# Patient Record
Sex: Female | Born: 1935
Health system: Southern US, Community
[De-identification: ages and names within clinical notes are randomized; demographics above are authoritative.]

## PROBLEM LIST (undated history)

## (undated) DIAGNOSIS — K30 Functional dyspepsia: Secondary | ICD-10-CM

## (undated) DIAGNOSIS — I10 Essential (primary) hypertension: Secondary | ICD-10-CM

## (undated) DIAGNOSIS — Z9889 Other specified postprocedural states: Secondary | ICD-10-CM

## (undated) DIAGNOSIS — M1990 Unspecified osteoarthritis, unspecified site: Secondary | ICD-10-CM

## (undated) DIAGNOSIS — E0590 Thyrotoxicosis, unspecified without thyrotoxic crisis or storm: Secondary | ICD-10-CM

## (undated) DIAGNOSIS — R112 Nausea with vomiting, unspecified: Secondary | ICD-10-CM

## (undated) HISTORY — PX: TOTAL THYROIDECTOMY: SHX2547

## (undated) HISTORY — PX: OTHER SURGICAL HISTORY: SHX169

## (undated) HISTORY — PX: ABDOMINAL HYSTERECTOMY: SHX81

## (undated) HISTORY — PX: EYE SURGERY: SHX253

## (undated) SURGERY — ARTHROSCOPY, KNEE
Anesthesia: General | Laterality: Right

---

## 1998-04-05 ENCOUNTER — Other Ambulatory Visit: Admission: RE | Admit: 1998-04-05 | Discharge: 1998-04-05 | Payer: Self-pay | Admitting: Obstetrics and Gynecology

## 1999-09-04 ENCOUNTER — Encounter: Admission: RE | Admit: 1999-09-04 | Discharge: 1999-09-04 | Payer: Self-pay | Admitting: Obstetrics and Gynecology

## 1999-09-04 ENCOUNTER — Encounter: Payer: Self-pay | Admitting: Obstetrics and Gynecology

## 2000-08-18 ENCOUNTER — Other Ambulatory Visit: Admission: RE | Admit: 2000-08-18 | Discharge: 2000-08-18 | Payer: Self-pay | Admitting: Obstetrics and Gynecology

## 2000-09-04 ENCOUNTER — Encounter: Admission: RE | Admit: 2000-09-04 | Discharge: 2000-09-04 | Payer: Self-pay | Admitting: Obstetrics and Gynecology

## 2000-09-04 ENCOUNTER — Encounter: Payer: Self-pay | Admitting: Obstetrics and Gynecology

## 2000-09-25 ENCOUNTER — Encounter: Payer: Self-pay | Admitting: Internal Medicine

## 2000-09-25 ENCOUNTER — Encounter: Admission: RE | Admit: 2000-09-25 | Discharge: 2000-09-25 | Payer: Self-pay | Admitting: Internal Medicine

## 2002-11-11 ENCOUNTER — Encounter: Admission: RE | Admit: 2002-11-11 | Discharge: 2002-11-11 | Payer: Self-pay | Admitting: Internal Medicine

## 2003-05-08 ENCOUNTER — Ambulatory Visit (HOSPITAL_COMMUNITY): Admission: RE | Admit: 2003-05-08 | Discharge: 2003-05-08 | Payer: Self-pay | Admitting: Gastroenterology

## 2003-07-12 ENCOUNTER — Encounter: Admission: RE | Admit: 2003-07-12 | Discharge: 2003-07-12 | Payer: Self-pay | Admitting: Internal Medicine

## 2003-11-24 ENCOUNTER — Encounter: Admission: RE | Admit: 2003-11-24 | Discharge: 2003-11-24 | Payer: Self-pay | Admitting: Internal Medicine

## 2005-10-22 ENCOUNTER — Encounter: Admission: RE | Admit: 2005-10-22 | Discharge: 2005-10-22 | Payer: Self-pay | Admitting: Endocrinology

## 2005-11-03 ENCOUNTER — Encounter (INDEPENDENT_AMBULATORY_CARE_PROVIDER_SITE_OTHER): Payer: Self-pay | Admitting: *Deleted

## 2005-11-03 ENCOUNTER — Encounter: Admission: RE | Admit: 2005-11-03 | Discharge: 2005-11-03 | Payer: Self-pay | Admitting: Endocrinology

## 2005-11-03 ENCOUNTER — Other Ambulatory Visit: Admission: RE | Admit: 2005-11-03 | Discharge: 2005-11-03 | Payer: Self-pay | Admitting: Interventional Radiology

## 2006-01-01 ENCOUNTER — Encounter: Admission: RE | Admit: 2006-01-01 | Discharge: 2006-01-01 | Payer: Self-pay | Admitting: Internal Medicine

## 2007-04-14 ENCOUNTER — Ambulatory Visit (HOSPITAL_COMMUNITY): Admission: RE | Admit: 2007-04-14 | Discharge: 2007-04-14 | Payer: Self-pay | Admitting: Internal Medicine

## 2007-11-25 ENCOUNTER — Encounter: Admission: RE | Admit: 2007-11-25 | Discharge: 2007-11-25 | Payer: Self-pay | Admitting: Endocrinology

## 2007-12-14 ENCOUNTER — Encounter (INDEPENDENT_AMBULATORY_CARE_PROVIDER_SITE_OTHER): Payer: Self-pay | Admitting: Interventional Radiology

## 2007-12-14 ENCOUNTER — Other Ambulatory Visit: Admission: RE | Admit: 2007-12-14 | Discharge: 2007-12-14 | Payer: Self-pay | Admitting: Interventional Radiology

## 2007-12-14 ENCOUNTER — Encounter: Admission: RE | Admit: 2007-12-14 | Discharge: 2007-12-14 | Payer: Self-pay | Admitting: Endocrinology

## 2008-03-10 ENCOUNTER — Encounter (INDEPENDENT_AMBULATORY_CARE_PROVIDER_SITE_OTHER): Payer: Self-pay | Admitting: Surgery

## 2008-03-10 ENCOUNTER — Ambulatory Visit (HOSPITAL_COMMUNITY): Admission: RE | Admit: 2008-03-10 | Discharge: 2008-03-11 | Payer: Self-pay | Admitting: Surgery

## 2008-04-03 ENCOUNTER — Encounter (HOSPITAL_COMMUNITY): Admission: RE | Admit: 2008-04-03 | Discharge: 2008-04-14 | Payer: Self-pay | Admitting: Endocrinology

## 2008-04-14 ENCOUNTER — Encounter (HOSPITAL_COMMUNITY): Admission: RE | Admit: 2008-04-14 | Discharge: 2008-05-16 | Payer: Self-pay | Admitting: Endocrinology

## 2008-05-31 ENCOUNTER — Encounter: Admission: RE | Admit: 2008-05-31 | Discharge: 2008-05-31 | Payer: Self-pay | Admitting: Internal Medicine

## 2008-07-11 ENCOUNTER — Encounter: Admission: RE | Admit: 2008-07-11 | Discharge: 2008-07-11 | Payer: Self-pay | Admitting: Internal Medicine

## 2008-11-06 ENCOUNTER — Encounter: Admission: RE | Admit: 2008-11-06 | Discharge: 2008-11-06 | Payer: Self-pay | Admitting: Surgery

## 2009-02-14 ENCOUNTER — Encounter: Admission: RE | Admit: 2009-02-14 | Discharge: 2009-02-14 | Payer: Self-pay | Admitting: Nephrology

## 2009-02-26 ENCOUNTER — Encounter: Admission: RE | Admit: 2009-02-26 | Discharge: 2009-02-26 | Payer: Self-pay | Admitting: Family Medicine

## 2009-03-05 ENCOUNTER — Encounter: Admission: RE | Admit: 2009-03-05 | Discharge: 2009-03-05 | Payer: Self-pay | Admitting: Neurosurgery

## 2009-07-13 ENCOUNTER — Encounter: Admission: RE | Admit: 2009-07-13 | Discharge: 2009-07-13 | Payer: Self-pay | Admitting: Family Medicine

## 2009-09-28 ENCOUNTER — Encounter: Admission: RE | Admit: 2009-09-28 | Discharge: 2009-09-28 | Payer: Self-pay | Admitting: Family Medicine

## 2009-10-08 ENCOUNTER — Encounter: Admission: RE | Admit: 2009-10-08 | Discharge: 2009-10-08 | Payer: Self-pay | Admitting: Nephrology

## 2010-04-01 ENCOUNTER — Other Ambulatory Visit: Payer: Self-pay | Admitting: Gastroenterology

## 2010-04-30 LAB — URINALYSIS, ROUTINE W REFLEX MICROSCOPIC
Bilirubin Urine: NEGATIVE
Glucose, UA: NEGATIVE mg/dL
Hgb urine dipstick: NEGATIVE
Ketones, ur: NEGATIVE mg/dL
Nitrite: NEGATIVE
Protein, ur: NEGATIVE mg/dL
Specific Gravity, Urine: 1.007 (ref 1.005–1.030)
Urobilinogen, UA: 0.2 mg/dL (ref 0.0–1.0)
pH: 5.5 (ref 5.0–8.0)

## 2010-04-30 LAB — DIFFERENTIAL
Basophils Absolute: 0 10*3/uL (ref 0.0–0.1)
Basophils Relative: 1 % (ref 0–1)
Eosinophils Relative: 1 % (ref 0–5)
Lymphocytes Relative: 27 % (ref 12–46)
Monocytes Absolute: 0.4 10*3/uL (ref 0.1–1.0)
Neutro Abs: 3.4 10*3/uL (ref 1.7–7.7)

## 2010-04-30 LAB — BASIC METABOLIC PANEL
BUN: 22 mg/dL (ref 6–23)
CO2: 27 mEq/L (ref 19–32)
Calcium: 9.4 mg/dL (ref 8.4–10.5)
Creatinine, Ser: 1.21 mg/dL — ABNORMAL HIGH (ref 0.4–1.2)
GFR calc non Af Amer: 44 mL/min — ABNORMAL LOW (ref >60)
Glucose, Bld: 88 mg/dL (ref 70–99)

## 2010-04-30 LAB — CBC
Hemoglobin: 14.3 g/dL (ref 12.0–15.0)
MCHC: 33.6 g/dL (ref 30.0–36.0)
Platelets: 168 10*3/uL (ref 150–400)
RDW: 13.6 % (ref 11.5–15.5)

## 2010-04-30 LAB — CALCIUM
Calcium: 8.6 mg/dL (ref 8.4–10.5)
Calcium: 9 mg/dL (ref 8.4–10.5)

## 2010-04-30 LAB — PROTIME-INR: Prothrombin Time: 13.8 seconds (ref 11.6–15.2)

## 2010-05-28 NOTE — Op Note (Signed)
NAME:  Martha [name], Martha [name]              ACCOUNT NO.:  [id number]   MEDICAL RECORD NO.:  [id number]          PATIENT TYPE:  AMB   LOCATION:  DAY                          FACILITY:  [facility]   PHYSICIAN:  Earnstine Regal, MD      DATE OF BIRTH:  01/10/1936   DATE OF PROCEDURE:  03/10/2008  DATE OF DISCHARGE:                               OPERATIVE REPORT   PREOPERATIVE DIAGNOSES:  Thyroid goiter with compressive symptoms.   POSTOPERATIVE DIAGNOSES:  Thyroid goiter with compressive symptoms.   PROCEDURE:  Total thyroidectomy.   SURGEON:  Earnstine Regal, MD, FACS   ASSISTANT:  Odis Hollingshead, MD, FACS   ANESTHESIA:  General.   ESTIMATED BLOOD LOSS:  Minimal.   PREPARATION:  Betadine.   COMPLICATIONS:  None.   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  She was referred by Jacelyn Pi, M.D.  for enlarging thyroid goiter while on thyroid hormone suppression.  She  has developed compressive symptoms.  Previous fine-needle aspiration  cytology has been benign.  The patient now comes to surgery for total  thyroidectomy.   BODY OF REPORT:  The procedure was done in OR #6 at the [facility].  The patient was brought to the operating room,  placed in supine position on the operating room table.  Following  administration of general anesthesia the patient was positioned and then  prepped and draped in the usual strict aseptic fashion.  After  ascertaining that an adequate level of anesthesia had been achieved, a  Kocher incision was made with a #15 blade.  Dissection was carried  through subcutaneous tissues and platysma.  The skin flaps were elevated  cephalad and caudad.  The Mahorner retractors were placed for exposure.  Strap muscles were incised in the midline.  Dissection was begun on the  left side of the neck.  The strap muscles were reflected laterally.  The  left thyroid lobe was multinodular.  It  was markedly enlarged.  The  middle thyroid vein was divided between medium Ligaclips with the  Harmonic scalpel.  The superior pole was dissected out.  The superior  pole vessels were ligated in continuity with 2-0 silk ties and medium  Ligaclips and divided with the Harmonic scalpel.  The gland was rolled  anteriorly.  Inferior venous tributaries were divided between medium  Ligaclips with the Harmonic scalpel.  The parathyroid tissue was  identified and preserved on its vascular pedicle.  The branches of the  inferior thyroid artery were divided between small Ligaclips with the  Harmonic scalpel.  The recurrent laryngeal nerve was identified and  preserved.  The gland was rolled further anteriorly and the ligament of  Gwenlyn Found was transected with electrocautery taking care to avoid the  recurrent nerve.  The gland was mobilized up and onto the anterior  trachea.  The isthmus was mobilized across the midline.  There was no  significant pyramidal lobe.  A dry pack was placed in the left neck.   Next, we turned our attention to the right  thyroid lobe.  Again, the  strap muscles were reflected laterally.  The middle thyroid vein was  divided between medium Ligaclips with the Harmonic scalpel.  The right  thyroid lobe was much smaller than the left side.  It has a few  scattered nodular densities.  The superior pole vessels were dissected  out and divided between medium Ligaclips with the Harmonic scalpel.  The  inferior and superior parathyroid glands were identified and preserved  on their vascular pedicle.  The inferior venous tributaries were divided  between medium Ligaclips with the Harmonic scalpel.  The branches of the  inferior thyroid artery were divided between small Ligaclips with the  Harmonic scalpel.  The ligament of Gwenlyn Found was transected with  electrocautery.  The gland was rolled up and onto the anterior trachea,  taking care to avoid the recurrent nerve which was clearly  visualized on  the right side.  The gland was excised off the anterior trachea.  It was  submitted in its entirety to pathology.  Sutures were used to mark the  right superior pole.   The neck was irrigated with warm saline.  Good hemostasis was noted  bilaterally.  Surgicel was placed in the operative field.  The strap  muscles were reapproximated in the midline with interrupted 3-0 Vicryl  sutures.  The platysma was closed with interrupted 3-0 Vicryl sutures.  The skin was closed with a running 4-0 Monocryl subcuticular suture.  The wound was washed and dried and Benzoin and Steri-Strips were  applied.  Sterile dressings were applied.  The patient was awakened from  anesthesia and brought to the recovery room in stable condition.  The  patient tolerated the procedure well.      Earnstine Regal, MD  Electronically Signed     TMG/MEDQ  D:  03/10/2008  T:  03/10/2008  Job:  [id number]   cc:   Jacelyn Pi, M.D.  Fax: [phone]   Royetta Crochet. Karlton Lemon, M.D.  Fax: [phone]

## 2010-05-31 NOTE — Op Note (Signed)
NAME:  Martha [name], LANDUYT                        ACCOUNT NO.:  [id number]   MEDICAL RECORD NO.:  [id number]                   PATIENT TYPE:  AMB   LOCATION:  ENDO                                 FACILITY:  [facility]   PHYSICIAN:  Nelwyn Salisbury, M.D.               DATE OF BIRTH:  10/20/1935   DATE OF PROCEDURE:  05/08/2003  DATE OF DISCHARGE:                                 OPERATIVE REPORT   PROCEDURE PERFORMED:  Screening colonoscopy.   ENDOSCOPIST:  Juanita Craver, M.D.   INSTRUMENT USED:  Olympus video colonoscope.   INDICATIONS FOR PROCEDURE:  The patient is a 75 year old female undergoing  screening colonoscopy to rule out colonic polyps, masses, etc.   PREPROCEDURE PREPARATION:  Informed consent was procured from the patient.  The patient was fasted for eight hours prior to the procedure and prepped  with a bottle of magnesium citrate and a gallon of GoLYTELY the night prior  to the procedure.   PREPROCEDURE PHYSICAL:  The patient had stable vital signs.  Neck supple.  Chest clear to auscultation.  S1 and S2 regular.  Abdomen soft with normal  bowel sounds.   DESCRIPTION OF PROCEDURE:  The patient was placed in left lateral decubitus  position and sedated with 60 mg of Demerol and 6 mg of Versed intravenously.  Once the patient was adequately sedated and maintained on low flow oxygen  and continuous cardiac monitoring, the Olympus video colonoscope was  advanced from the rectum to the cecum.  The appendicular orifice and  ileocecal valve were clearly visualized and photographed.  Multiple washes  were done.  No masses, polyps, erosions, ulcerations or diverticula were  seen.  Small internal hemorrhoids were recognized on retroflexion in the  rectum.  The patient tolerated the procedure well without immediate  complications.   IMPRESSION:  Normal colonoscopy up to the cecum except for small internal  hemorrhoids.   RECOMMENDATIONS:  1. Continue high fiber diet with liberal  fluid intake.  2. Repeat colorectal cancer screening is recommended in the next 10 years     unless the patient develops any abnormal symptoms in the interim.  3. Outpatient followup as need arises in the future.                                               Nelwyn Salisbury, M.D.    JNM/MEDQ  D:  05/08/2003  T:  05/08/2003  Job:  [id number]   cc:   Royetta Crochet. Karlton Lemon, M.D.  [address]  [address] [place]  [place] [address]  Fax: [phone]

## 2010-07-18 ENCOUNTER — Other Ambulatory Visit (HOSPITAL_BASED_OUTPATIENT_CLINIC_OR_DEPARTMENT_OTHER): Payer: Self-pay

## 2010-07-18 DIAGNOSIS — Z1231 Encounter for screening mammogram for malignant neoplasm of breast: Secondary | ICD-10-CM

## 2010-07-23 ENCOUNTER — Ambulatory Visit
Admission: RE | Admit: 2010-07-23 | Discharge: 2010-07-23 | Disposition: A | Discharge status: Home / Self Care | Payer: Medicare Other | Source: Ambulatory Visit

## 2010-07-23 ENCOUNTER — Other Ambulatory Visit: Payer: Self-pay

## 2010-07-23 ENCOUNTER — Ambulatory Visit (HOSPITAL_BASED_OUTPATIENT_CLINIC_OR_DEPARTMENT_OTHER): Payer: Self-pay

## 2010-07-23 DIAGNOSIS — Z1231 Encounter for screening mammogram for malignant neoplasm of breast: Secondary | ICD-10-CM

## 2011-07-15 ENCOUNTER — Encounter (HOSPITAL_COMMUNITY): Payer: Self-pay | Admitting: Pharmacy Technician

## 2011-07-21 ENCOUNTER — Other Ambulatory Visit: Payer: Self-pay | Admitting: Orthopedic Surgery

## 2011-07-22 ENCOUNTER — Ambulatory Visit (HOSPITAL_COMMUNITY)
Admission: RE | Admit: 2011-07-22 | Discharge: 2011-07-22 | Disposition: A | Discharge status: Home / Self Care | Payer: Medicare Other | Source: Ambulatory Visit | Attending: Orthopedic Surgery | Admitting: Orthopedic Surgery

## 2011-07-22 ENCOUNTER — Encounter (HOSPITAL_COMMUNITY): Payer: Self-pay

## 2011-07-22 ENCOUNTER — Encounter (HOSPITAL_COMMUNITY)
Admission: RE | Admit: 2011-07-22 | Discharge: 2011-07-22 | Disposition: A | Discharge status: Home / Self Care | Payer: Medicare Other | Source: Ambulatory Visit | Attending: Orthopedic Surgery | Admitting: Orthopedic Surgery

## 2011-07-22 DIAGNOSIS — I10 Essential (primary) hypertension: Secondary | ICD-10-CM | POA: Insufficient documentation

## 2011-07-22 DIAGNOSIS — Z01810 Encounter for preprocedural cardiovascular examination: Secondary | ICD-10-CM | POA: Insufficient documentation

## 2011-07-22 DIAGNOSIS — Z01812 Encounter for preprocedural laboratory examination: Secondary | ICD-10-CM | POA: Insufficient documentation

## 2011-07-22 DIAGNOSIS — Z01818 Encounter for other preprocedural examination: Secondary | ICD-10-CM | POA: Insufficient documentation

## 2011-07-22 HISTORY — DX: Thyrotoxicosis, unspecified without thyrotoxic crisis or storm: E05.90

## 2011-07-22 HISTORY — DX: Other specified postprocedural states: Z98.890

## 2011-07-22 HISTORY — DX: Nausea with vomiting, unspecified: R11.2

## 2011-07-22 HISTORY — DX: Functional dyspepsia: K30

## 2011-07-22 HISTORY — DX: Essential (primary) hypertension: I10

## 2011-07-22 LAB — URINALYSIS, ROUTINE W REFLEX MICROSCOPIC
Hgb urine dipstick: NEGATIVE
Leukocytes, UA: NEGATIVE
Specific Gravity, Urine: 1.01 (ref 1.005–1.030)
Urobilinogen, UA: 0.2 mg/dL (ref 0.0–1.0)

## 2011-07-22 LAB — COMPREHENSIVE METABOLIC PANEL
ALT: 12 U/L (ref 0–35)
AST: 24 U/L (ref 0–37)
Albumin: 3.6 g/dL (ref 3.5–5.2)
Alkaline Phosphatase: 48 U/L (ref 39–117)
Glucose, Bld: 92 mg/dL (ref 70–99)
Potassium: 4.1 mEq/L (ref 3.5–5.1)
Sodium: 138 mEq/L (ref 135–145)
Total Protein: 7.7 g/dL (ref 6.0–8.3)

## 2011-07-22 LAB — SURGICAL PCR SCREEN
MRSA, PCR: NEGATIVE
Staphylococcus aureus: NEGATIVE

## 2011-07-22 LAB — CBC
Hemoglobin: 13.5 g/dL (ref 12.0–15.0)
MCHC: 32.8 g/dL (ref 30.0–36.0)
Platelets: 172 10*3/uL (ref 150–400)
RDW: 14.4 % (ref 11.5–15.5)

## 2011-07-22 NOTE — Pre-Procedure Instructions (Addendum)
[name]   07/22/2011   Your procedure is scheduled on: July 11TH, Thursday   Report to [facility] at  5:30  AM.  Call this number if you have problems the morning of surgery: [phone]   Remember:   Do not eat food or drink any liquids :After Midnight Wednesday                 Take these medicines the morning of surgery with A SIP OF WATER: Thyroid              medicine   Do not wear jewelry, make-up or nail polish.  Do not wear lotions, powders, or perfumes. You may wear deodorant.  Do not shave 48 hours prior to surgery. Men may shave face and neck.   Do not bring valuables to the hospital.   Contacts, dentures or bridgework may not be worn into surgery.  Leave suitcase in the car. After surgery it may be brought to your room.  For patients admitted to the hospital, checkout time is 11:00 AM the day of discharge.   Patients discharged the day of surgery will not be allowed to drive home.  Name and phone number of your driver:   Special Instructions: CHG Shower Use Special Wash: 1/2 bottle night before surgery and 1/2 bottle morning of surgery.   Please read over the following fact sheets that you were given: Pain Booklet, MRSA Information and Surgical Site Infection Prevention

## 2011-07-23 ENCOUNTER — Encounter (HOSPITAL_COMMUNITY): Payer: Self-pay | Admitting: Vascular Surgery

## 2011-07-23 MED ORDER — CEFAZOLIN SODIUM-DEXTROSE 2-3 GM-% IV SOLR
2.0000 g | INTRAVENOUS | Status: AC
  Administered 2011-07-24: 2 g via INTRAVENOUS
  Filled 2011-07-23: qty 50

## 2011-07-23 NOTE — Consult Note (Signed)
Anesthesia Chart Review:  Patient is a 76 year old female scheduled for right knee arthroscopy on 07/24/11 by Dr. Eulas Post.  History includes non-smoker, post-operative N/V, acid reflux, HTN, goiter s/p thyroidectomy '10 with secondary hypothyroidism.   EKG on 07/22/11 showed NSR, borderline LAD, anterior infarct (age undetermined).  She now has a low r wave in V3, otherwise her EKG appears similar to 03/10/08.  She was seen at [facility] in the past, but not since 2006.    CXR on 07/22/11 showed flattening of diaphragm on lateral image with overall mild hyperinflation configuration. No acute superimposed abnormality is evident.  Labs noted.  BUN 32/Cr 1.22.  CBC WNL.    No CV symptoms were documented at her PAT visit.  She has no known history of CAD//MI/CHF or DM.  If remains asymptomatic then anticipate she can proceed as planned.  Myra Gianotti, PA-C

## 2011-07-24 ENCOUNTER — Ambulatory Visit (HOSPITAL_COMMUNITY)
Admission: RE | Admit: 2011-07-24 | Discharge: 2011-07-24 | Disposition: A | Discharge status: Home / Self Care | Payer: Medicare Other | Source: Ambulatory Visit | Attending: Orthopedic Surgery | Admitting: Orthopedic Surgery

## 2011-07-24 ENCOUNTER — Encounter (HOSPITAL_COMMUNITY): Payer: Self-pay | Admitting: Vascular Surgery

## 2011-07-24 ENCOUNTER — Encounter (HOSPITAL_COMMUNITY)
Admission: RE | Disposition: A | Discharge status: Home / Self Care | Payer: Self-pay | Source: Ambulatory Visit | Attending: Orthopedic Surgery

## 2011-07-24 ENCOUNTER — Ambulatory Visit (HOSPITAL_COMMUNITY): Payer: Medicare Other | Admitting: Vascular Surgery

## 2011-07-24 DIAGNOSIS — M109 Gout, unspecified: Secondary | ICD-10-CM | POA: Insufficient documentation

## 2011-07-24 DIAGNOSIS — M23329 Other meniscus derangements, posterior horn of medial meniscus, unspecified knee: Secondary | ICD-10-CM | POA: Insufficient documentation

## 2011-07-24 DIAGNOSIS — E890 Postprocedural hypothyroidism: Secondary | ICD-10-CM | POA: Insufficient documentation

## 2011-07-24 DIAGNOSIS — M959 Acquired deformity of musculoskeletal system, unspecified: Secondary | ICD-10-CM | POA: Insufficient documentation

## 2011-07-24 DIAGNOSIS — G8918 Other acute postprocedural pain: Secondary | ICD-10-CM

## 2011-07-24 DIAGNOSIS — M23359 Other meniscus derangements, posterior horn of lateral meniscus, unspecified knee: Secondary | ICD-10-CM | POA: Insufficient documentation

## 2011-07-24 DIAGNOSIS — I10 Essential (primary) hypertension: Secondary | ICD-10-CM | POA: Insufficient documentation

## 2011-07-24 HISTORY — PX: KNEE ARTHROSCOPY: SHX127

## 2011-07-24 SURGERY — ARTHROSCOPY, KNEE
Anesthesia: General | Site: Knee | Laterality: Right | Wound class: Clean

## 2011-07-24 MED ORDER — OXYCODONE-ACETAMINOPHEN 7.5-325 MG PO TABS: 1.0000 | ORAL_TABLET | ORAL | Status: AC | PRN

## 2011-07-24 MED ORDER — DEXAMETHASONE SODIUM PHOSPHATE 4 MG/ML IJ SOLN
INTRAMUSCULAR | Status: DC | PRN
  Administered 2011-07-24: 4 mg via INTRAVENOUS

## 2011-07-24 MED ORDER — SCOPOLAMINE 1 MG/3DAYS TD PT72
MEDICATED_PATCH | TRANSDERMAL | Status: DC | PRN
  Administered 2011-07-24: 1 via TRANSDERMAL

## 2011-07-24 MED ORDER — SCOPOLAMINE 1 MG/3DAYS TD PT72
1.0000 | MEDICATED_PATCH | TRANSDERMAL | Status: DC
  Filled 2011-07-24: qty 1

## 2011-07-24 MED ORDER — MIDAZOLAM HCL 5 MG/5ML IJ SOLN
INTRAMUSCULAR | Status: DC | PRN
  Administered 2011-07-24: 1 mg via INTRAVENOUS

## 2011-07-24 MED ORDER — CHLORHEXIDINE GLUCONATE 4 % EX LIQD: 60.0000 mL | Freq: Once | CUTANEOUS | Status: DC

## 2011-07-24 MED ORDER — SODIUM CHLORIDE 0.9 % IR SOLN
Status: DC | PRN
  Administered 2011-07-24 (×3): 3000 mL

## 2011-07-24 MED ORDER — PROPOFOL 10 MG/ML IV BOLUS
INTRAVENOUS | Status: DC | PRN
  Administered 2011-07-24: 130 mg via INTRAVENOUS

## 2011-07-24 MED ORDER — BUPIVACAINE HCL (PF) 0.25 % IJ SOLN
INTRAMUSCULAR | Status: DC | PRN
  Administered 2011-07-24: 14 mL

## 2011-07-24 MED ORDER — HYDROMORPHONE HCL PF 1 MG/ML IJ SOLN: 0.2500 mg | INTRAMUSCULAR | Status: DC | PRN

## 2011-07-24 MED ORDER — ONDANSETRON HCL 4 MG/2ML IJ SOLN
INTRAMUSCULAR | Status: AC
  Filled 2011-07-24: qty 2

## 2011-07-24 MED ORDER — PHENYLEPHRINE HCL 10 MG/ML IJ SOLN
INTRAMUSCULAR | Status: DC | PRN
  Administered 2011-07-24: 80 ug via INTRAVENOUS

## 2011-07-24 MED ORDER — ONDANSETRON HCL 4 MG/2ML IJ SOLN
4.0000 mg | Freq: Once | INTRAMUSCULAR | Status: AC | PRN
  Administered 2011-07-24: 4 mg via INTRAVENOUS

## 2011-07-24 MED ORDER — LIDOCAINE HCL (CARDIAC) 20 MG/ML IV SOLN
INTRAVENOUS | Status: DC | PRN
  Administered 2011-07-24: 80 mg via INTRAVENOUS

## 2011-07-24 MED ORDER — ONDANSETRON HCL 4 MG/2ML IJ SOLN
INTRAMUSCULAR | Status: DC | PRN
  Administered 2011-07-24: 4 mg via INTRAVENOUS

## 2011-07-24 MED ORDER — BUPIVACAINE HCL (PF) 0.25 % IJ SOLN
INTRAMUSCULAR | Status: AC
  Filled 2011-07-24: qty 30

## 2011-07-24 MED ORDER — FENTANYL CITRATE 0.05 MG/ML IJ SOLN
INTRAMUSCULAR | Status: DC | PRN
  Administered 2011-07-24 (×3): 50 ug via INTRAVENOUS

## 2011-07-24 MED ORDER — LACTATED RINGERS IV SOLN
INTRAVENOUS | Status: DC | PRN
  Administered 2011-07-24: 07:00:00 via INTRAVENOUS

## 2011-07-24 SURGICAL SUPPLY — 36 items
BANDAGE ELASTIC 4 VELCRO ST LF (GAUZE/BANDAGES/DRESSINGS) ×2 IMPLANT
BANDAGE ELASTIC 6 VELCRO ST LF (GAUZE/BANDAGES/DRESSINGS) ×2 IMPLANT
BANDAGE GAUZE ELAST BULKY 4 IN (GAUZE/BANDAGES/DRESSINGS) ×2 IMPLANT
BLADE CUDA 5.5 (BLADE) IMPLANT
BLADE GREAT WHITE 4.2 (BLADE) ×2 IMPLANT
CHLORAPREP W/TINT 26ML (MISCELLANEOUS) ×2 IMPLANT
CLOTH BEACON ORANGE TIMEOUT ST (SAFETY) ×2 IMPLANT
COVER SURGICAL LIGHT HANDLE (MISCELLANEOUS) ×2 IMPLANT
CUFF TOURNIQUET SINGLE 34IN LL (TOURNIQUET CUFF) ×2 IMPLANT
CUFF TOURNIQUET SINGLE 44IN (TOURNIQUET CUFF) IMPLANT
DECANTER SPIKE VIAL GLASS SM (MISCELLANEOUS) IMPLANT
DRAPE ARTHROSCOPY W/POUCH 114 (DRAPES) ×2 IMPLANT
DRAPE PROXIMA HALF (DRAPES) ×2 IMPLANT
DRAPE U-SHAPE 47X51 STRL (DRAPES) ×2 IMPLANT
DRSG EMULSION OIL 3X3 NADH (GAUZE/BANDAGES/DRESSINGS) ×2 IMPLANT
DRSG PAD ABDOMINAL 8X10 ST (GAUZE/BANDAGES/DRESSINGS) ×2 IMPLANT
DURAPREP 26ML APPLICATOR (WOUND CARE) IMPLANT
GLOVE BIOGEL PI IND STRL 7.0 (GLOVE) ×2 IMPLANT
GLOVE BIOGEL PI INDICATOR 7.0 (GLOVE) ×2
GLOVE SS PI 9.0 STRL (GLOVE) ×2 IMPLANT
GLOVE SURG SS PI 7.0 STRL IVOR (GLOVE) ×4 IMPLANT
GOWN PREVENTION PLUS XLARGE (GOWN DISPOSABLE) ×4 IMPLANT
GOWN STRL NON-REIN LRG LVL3 (GOWN DISPOSABLE) ×2 IMPLANT
KIT BASIN OR (CUSTOM PROCEDURE TRAY) ×2 IMPLANT
KIT ROOM TURNOVER OR (KITS) ×2 IMPLANT
MANIFOLD NEPTUNE II (INSTRUMENTS) ×2 IMPLANT
PACK ARTHROSCOPY DSU (CUSTOM PROCEDURE TRAY) ×2 IMPLANT
PAD ARMBOARD 7.5X6 YLW CONV (MISCELLANEOUS) ×2 IMPLANT
SET ARTHROSCOPY TUBING (MISCELLANEOUS) ×1
SET ARTHROSCOPY TUBING LN (MISCELLANEOUS) ×1 IMPLANT
SPONGE GAUZE 4X4 12PLY (GAUZE/BANDAGES/DRESSINGS) ×2 IMPLANT
SPONGE LAP 4X18 X RAY DECT (DISPOSABLE) ×2 IMPLANT
SUT ETHILON 4 0 PS 2 18 (SUTURE) ×2 IMPLANT
SYR CONTROL 10ML LL (SYRINGE) ×2 IMPLANT
TOWEL OR 17X24 6PK STRL BLUE (TOWEL DISPOSABLE) IMPLANT
WATER STERILE IRR 1000ML POUR (IV SOLUTION) ×2 IMPLANT

## 2011-07-24 NOTE — Progress Notes (Signed)
Report given to [facility] rn caregiver

## 2011-07-24 NOTE — Progress Notes (Addendum)
Patient reports that she is still nauseated but does not want any additional medication and request to be discharged states that she feels like that she needs to rest at home for it to resolve. Patient educated to start with clear liquids then to advance as tolerated. Patient states she will notify MD if problem persist after tomorrow.

## 2011-07-24 NOTE — Anesthesia Postprocedure Evaluation (Signed)
Anesthesia Post Note  Patient: Martha [name]  Procedure(s) Performed: Procedure(s) (LRB): ARTHROSCOPY KNEE (Right)  Anesthesia type: general  Patient location: PACU  Post pain: Pain level controlled  Post assessment: Patient's Cardiovascular Status Stable  Last Vitals:  Filed Vitals:   07/24/11 0945  BP: 154/78  Pulse: 58  Temp:   Resp: 31    Post vital signs: Reviewed and stable  Level of consciousness: sedated  Complications: No apparent anesthesia complications

## 2011-07-24 NOTE — Transfer of Care (Signed)
Immediate Anesthesia Transfer of Care Note  Patient: Martha [name]  Procedure(s) Performed: Procedure(s) (LRB): ARTHROSCOPY KNEE (Right)  Patient Location: PACU  Anesthesia Type: MAC  Level of Consciousness: awake  Airway & Oxygen Therapy: Patient Spontanous Breathing and Patient connected to face mask oxygen  Post-op Assessment: Report given to PACU RN and Post -op Vital signs reviewed and stable  Post vital signs: Reviewed and stable  Complications: No apparent anesthesia complications

## 2011-07-24 NOTE — Preoperative (Addendum)
Beta Blockers   Reason not to administer Beta Blockers:Not Applicable 

## 2011-07-24 NOTE — Progress Notes (Signed)
Spoke with Kirtland Bouchard, RN Case Manager to obtain walker for patient per order in Epic prior to discharge

## 2011-07-24 NOTE — Op Note (Signed)
NAME:  Martha [name], Martha [name] NO.:  [id number]  MEDICAL RECORD NO.:  [id number]  LOCATION:  [facility]                         FACILITY:  [facility]  PHYSICIAN:  Marily Memos, MD      DATE OF BIRTH:  10/26/1935  DATE OF PROCEDURE:  07/24/2011 DATE OF DISCHARGE:                              OPERATIVE REPORT   PREOPERATIVE DIAGNOSIS:  Internal derangement, right knee.  POSTOPERATIVE DIAGNOSES: 1. Medial meniscal tear. 2. Lateral meniscal tear. 3. Medial femoral condylar defect. 4. Gouty arthropathy.  ANESTHESIA:  General.  PROCEDURE: 1. Medial meniscectomy. 2. Partial lateral meniscectomy. 3. Chondroplasty medial femoral condyle. 4. Synovectomy.  DESCRIPTION OF PROCEDURE:  The patient was taken to the operating room. After given adequate preop medications, given general anesthesia and intubated.  Right knee was prepped and draped in sterile fashion. Tourniquet used for hemostasis.  A 1/2-inch puncture wound made along the anterior, medial, and lateral joint line.  Inflow of water to the medial suprapatellar pouch area.  Inspection of the joint revealed posterior horn tear of the medial meniscus, posterolateral tear of the lateral meniscus, gouty arthropathy, both medial lateral compartment, chronic synovitis medial and lateral compartment, and chondral defect, medial femoral condyle, size of a 0.5 cent piece.  With the meniscal shaver, complete synovectomy was done followed by use of the bed basket forceps, partial medial meniscectomy was done and partial lateral meniscectomy was done.  With the use of meniscal shaver, chondroplasty of medial femoral condyle was done and further smoothened.  Copious and abundant irrigation was done.  Wound closure was then done with 4-0 nylon.  14 mL of 0.25% plain Marcaine was injected into the knee. Compressive dressing was applied.  The patient tolerated the procedure quite well, went to recovery room in stable and satisfactory  condition. The patient is being kept 23-hour observation, will be discharged home. Partial weightbearing with use of walker.  Ice packs, Percocet 1-2 q.4 p.r.n. for pain, and to return to the office in 1 week.    Marily Memos, MD    AC/MEDQ  D:  07/24/2011  T:  07/24/2011  Job:  [id number]

## 2011-07-24 NOTE — Brief Op Note (Signed)
07/24/2011  9:16 AM  PATIENT:  Martha [name]  76 y.o. female  PRE-OPERATIVE DIAGNOSIS:  INTERNAL DERANGEMENT RIGHT KNEE  POST-OPERATIVE DIAGNOSIS:  INTERNAL DERANGEMENT RIGHT KNEE  PROCEDURE:  Procedure(s) (LRB): ARTHROSCOPY KNEE (Right)  SURGEON:  Surgeon(s) and Role:    * Sharmon Revere, MD - Primary  PHYSICIAN ASSISTANT:   ASSISTANTS: none   ANESTHESIA:   general  EBL:  Total I/O In: 500 [I.V.:500] Out: -   BLOOD ADMINISTERED:none  DRAINS: none   LOCAL MEDICATIONS USED:  MARCAINE     SPECIMEN:  No Specimen  DISPOSITION OF SPECIMEN:  N/A  COUNTS:  YES  TOURNIQUET:   Total Tourniquet Time Documented: Thigh (Right) - 54 minutes  DICTATION: .Other Dictation: Dictation Number [id number]  PLAN OF CARE: Discharge to home after PACU  PATIENT DISPOSITION:  PACU - hemodynamically stable.   Delay start of Pharmacological VTE agent (>24hrs) due to surgical blood loss or risk of bleeding: not applicable

## 2011-07-24 NOTE — H&P (Signed)
NAME:  Martha, [name] NO.:  [id number]  MEDICAL RECORD NO.:  [id number]  LOCATION:  [facility]                         FACILITY:  [facility]  PHYSICIAN:  Marily Memos, MD      DATE OF BIRTH:  1935/12/06  DATE OF ADMISSION:  07/24/2011 DATE OF DISCHARGE:                             HISTORY & PHYSICAL   CHIEF COMPLAINT:  Painful locking, right knee.  HISTORY OF PRESENT ILLNESS:  This is a 76 year old female who had sustained injury to her right knee several months ago and was initially treated for internal derangement with medication which brought her joint under control, being asymptomatic, and 2 months later the patient developed severe pain, swelling, and locking of the knee and inability to weight bear on the right side.  The patient was again treated with some anti-inflammatories and pain medication, but with persisting pain, effusion, and limited function.  PAST MEDICAL HISTORY:  Acid reflux, hypertension, hyperthyroidism.  PAST SURGICAL HISTORY:  Goiter removal, right eye surgery in 1972, abdominal hysterectomy, and total thyroidectomy.  ALLERGIES:  CONTRAST MEDIA.  FAMILY HISTORY:  Noncontributory.  MEDICATIONS:  Os-Cal, vitamin D, Synthroid 100 mcg, multivitamin, Benicar/HCT 20/12.5, Crestor 20 mg, Lodine 400 mg b.i.d.  REVIEW OF SYSTEMS:  Basically that of history of present illness.  No cardiac, respiratory, urinary or bowel symptoms.  PHYSICAL EXAMINATION:  VITAL SIGNS:  Temperature 98.1, pulse 63, respirations 18, blood pressure 142/90, O2 saturation 98%.  Height 5 feet 2 inches, weight 70.481 kg. HEAD:  Normocephalic. EYES:  Conjunctivae clear. NECK:  Supple. CHEST:  Clear. CARDIAC:  S1, S2 regular. EXTREMITIES:  Right knee +2 effusion, tender medial and laterally.  The patient lacks full range of motion.  Lacks full extension as well as flexion.  Positive McMurray's test with palpable and audible click. Negative drawer sign, negative Lachman  sign.  IMPRESSION:  Internal derangement, right knee.  PLAN:  Arthroscopy, right knee.     Marily Memos, MD     AC/MEDQ  D:  07/24/2011  T:  07/24/2011  Job:  [id number]

## 2011-07-24 NOTE — Anesthesia Preprocedure Evaluation (Addendum)
Anesthesia Evaluation  Patient identified by MRN, date of birth, ID band Patient awake    Reviewed: Allergy & Precautions, H&P , NPO status , reviewed documented beta blocker date and time   History of Anesthesia Complications (+) PONV  Airway Mallampati: I TM Distance: >3 FB Neck ROM: Full    Dental  (+) Dental Advisory Given   Pulmonary          Cardiovascular hypertension, Pt. on medications     Neuro/Psych    GI/Hepatic   Endo/Other  Hyperthyroidism   Renal/GU      Musculoskeletal   Abdominal   Peds  Hematology   Anesthesia Other Findings   Reproductive/Obstetrics                          Anesthesia Physical Anesthesia Plan  ASA: II  Anesthesia Plan: General   Post-op Pain Management:    Induction: Intravenous  Airway Management Planned: LMA  Additional Equipment:   Intra-op Plan:   Post-operative Plan: Extubation in OR  Informed Consent: I have reviewed the patients History and Physical, chart, labs and discussed the procedure including the risks, benefits and alternatives for the proposed anesthesia with the patient or authorized representative who has indicated his/her understanding and acceptance.     Plan Discussed with: CRNA, Surgeon and Anesthesiologist  Anesthesia Plan Comments:        Anesthesia Quick Evaluation

## 2011-07-24 NOTE — Progress Notes (Signed)
Referral made to advanced for a rolling walker.  Spoke with Derrien to ensure pt receives walker prior to discharge.

## 2011-07-24 NOTE — H&P (Signed)
Martha [name] is an 76 y.o. female.   Chief Complaint: THIS IS A 76Y/O FEMALE WHO HAS BEEN TREATED FOR INTERNAL DERANGEMENT OF THE RIGHT KNEE FOR PAST FEW MONTHS WITH INCREASE IN PAIN LOCKING, AND LOSS OF FLEXIBILITY . HPI: SEE ABOVE.  Past Medical History  Diagnosis Date  . PONV (postoperative nausea and vomiting)   . Acid indigestion     periodically---takes tums for relief  . Hypertension     takes benicar  . Hyperthyroidism     thyroidectomy    Past Surgical History  Procedure Date  . Goiter     removed  . Eye surgery     1972  right eye  . Abdominal hysterectomy   . Total thyroidectomy     2010-for goiter with compressive symptoms    No family history on file. Social History:  reports that she has never smoked. She does not have any smokeless tobacco history on file. She reports that she does not drink alcohol or use illicit drugs.  Allergies:  Allergies  Allergen Reactions  . Contrast Media (Iodinated Diagnostic Agents) Hives and Nausea And Vomiting    Medications Prior to Admission  Medication Sig Dispense Refill  . calcium carbonate (OS-CAL) 600 MG TABS Take 600 mg by mouth 2 (two) times daily with a meal.      . cholecalciferol (VITAMIN D) 1000 UNITS tablet Take 1,000 Units by mouth daily.      Marland Kitchen levothyroxine (SYNTHROID, LEVOTHROID) 100 MCG tablet Take 100 mcg by mouth daily.      . Multiple Vitamin (MULTIVITAMIN WITH MINERALS) TABS Take 1 tablet by mouth daily.      Marland Kitchen olmesartan-hydrochlorothiazide (BENICAR HCT) 20-12.5 MG per tablet Take 1 tablet by mouth every other day.      . rosuvastatin (CRESTOR) 10 MG tablet Take 10 mg by mouth daily.        Results for orders placed during the hospital encounter of 07/22/11 (from the past 48 hour(s))  URINALYSIS, ROUTINE W REFLEX MICROSCOPIC     Status: Normal   Collection Time   07/22/11 11:10 AM      Component Value Range Comment   Color, Urine YELLOW  YELLOW    APPearance CLEAR  CLEAR    Specific Gravity,  Urine 1.010  1.005 - 1.030    pH 5.0  5.0 - 8.0    Glucose, UA NEGATIVE  NEGATIVE mg/dL    Hgb urine dipstick NEGATIVE  NEGATIVE    Bilirubin Urine NEGATIVE  NEGATIVE    Ketones, ur NEGATIVE  NEGATIVE mg/dL    Protein, ur NEGATIVE  NEGATIVE mg/dL    Urobilinogen, UA 0.2  0.0 - 1.0 mg/dL    Nitrite NEGATIVE  NEGATIVE    Leukocytes, UA NEGATIVE  NEGATIVE MICROSCOPIC NOT DONE ON URINES WITH NEGATIVE PROTEIN, BLOOD, LEUKOCYTES, NITRITE, OR GLUCOSE <1000 mg/dL.  SURGICAL PCR SCREEN     Status: Normal   Collection Time   07/22/11 11:10 AM      Component Value Range Comment   MRSA, PCR NEGATIVE  NEGATIVE    Staphylococcus aureus NEGATIVE  NEGATIVE   CBC     Status: Normal   Collection Time   07/22/11 11:15 AM      Component Value Range Comment   WBC 5.1  4.0 - 10.5 K/uL    RBC 4.24  3.87 - 5.11 MIL/uL    Hemoglobin 13.5  12.0 - 15.0 g/dL    HCT 41.1  36.0 - 46.0 %  MCV 96.9  78.0 - 100.0 fL    MCH 31.8  26.0 - 34.0 pg    MCHC 32.8  30.0 - 36.0 g/dL    RDW 14.4  11.5 - 15.5 %    Platelets 172  150 - 400 K/uL   COMPREHENSIVE METABOLIC PANEL     Status: Abnormal   Collection Time   07/22/11 11:15 AM      Component Value Range Comment   Sodium 138  135 - 145 mEq/L    Potassium 4.1  3.5 - 5.1 mEq/L    Chloride 101  96 - 112 mEq/L    CO2 25  19 - 32 mEq/L    Glucose, Bld 92  70 - 99 mg/dL    BUN 32 (*) 6 - 23 mg/dL    Creatinine, Ser 1.22 (*) 0.50 - 1.10 mg/dL    Calcium 8.8  8.4 - 10.5 mg/dL    Total Protein 7.7  6.0 - 8.3 g/dL    Albumin 3.6  3.5 - 5.2 g/dL    AST 24  0 - 37 U/L    ALT 12  0 - 35 U/L    Alkaline Phosphatase 48  39 - 117 U/L    Total Bilirubin 0.2 (*) 0.3 - 1.2 mg/dL    GFR calc non Af Amer 43 (*) >90 mL/min    GFR calc Af Amer 49 (*) >90 mL/min    Dg Chest 2 View  07/22/2011  *RADIOLOGY REPORT*  Clinical Data: Preoperative evaluation.  History of hypertension.  CHEST - 2 VIEW  Comparison: 05/31/2008.  CT 07/11/2008.  Findings: There is a normal appearance of the  cardiac silhouette. Mediastinal and hilar contours appear stable.  There is flattening of the diaphragm on lateral image with overall mild hyperinflation configuration.  No pulmonary infiltrates or nodules were evident. No pleural abnormality is evident.  There is slightly osteopenic appearance of bones.  IMPRESSION: Flattening of diaphragm on lateral image with overall mild hyperinflation configuration.  No acute superimposed abnormality is evident.  Original Report Authenticated By: Delane Ginger, M.D.    Review of Systems  Constitutional: Negative.   HENT: Negative.   Eyes: Negative.   Respiratory: Negative.   Cardiovascular: Negative.   Gastrointestinal: Negative.   Genitourinary: Negative.   Musculoskeletal: Positive for joint pain.  Skin: Negative.   Neurological: Negative.   Endo/Heme/Allergies: Negative.   Psychiatric/Behavioral: Negative.     Blood pressure 142/90, pulse 63, temperature 98.1 F (36.7 C), temperature source Oral, resp. rate 18, SpO2 98.00%. Physical Exam RIGHT KNEE WITH LIMITED ROM PLUS 2 EFFUSION, TENDER , POSITIVE MCMURRAY'S TEST .  Assessment/Plan INTERNAL DERANGEMENT RIGHT KNEE/PLAN ARTHROSCOPY RIGHT KNEE.  Sharmon Revere 07/24/2011, 7:39 AM

## 2011-07-25 ENCOUNTER — Encounter (HOSPITAL_COMMUNITY): Payer: Self-pay | Admitting: Orthopedic Surgery

## 2011-08-13 ENCOUNTER — Ambulatory Visit: Payer: Medicare Other | Attending: Orthopedic Surgery

## 2011-08-13 DIAGNOSIS — R5381 Other malaise: Secondary | ICD-10-CM | POA: Insufficient documentation

## 2011-08-13 DIAGNOSIS — IMO0001 Reserved for inherently not codable concepts without codable children: Secondary | ICD-10-CM | POA: Insufficient documentation

## 2011-08-13 DIAGNOSIS — R269 Unspecified abnormalities of gait and mobility: Secondary | ICD-10-CM | POA: Insufficient documentation

## 2011-08-13 DIAGNOSIS — M25669 Stiffness of unspecified knee, not elsewhere classified: Secondary | ICD-10-CM | POA: Insufficient documentation

## 2011-08-18 ENCOUNTER — Ambulatory Visit: Payer: Medicare Other | Attending: Family Medicine

## 2011-08-18 DIAGNOSIS — R5381 Other malaise: Secondary | ICD-10-CM | POA: Insufficient documentation

## 2011-08-18 DIAGNOSIS — R269 Unspecified abnormalities of gait and mobility: Secondary | ICD-10-CM | POA: Insufficient documentation

## 2011-08-18 DIAGNOSIS — IMO0001 Reserved for inherently not codable concepts without codable children: Secondary | ICD-10-CM | POA: Insufficient documentation

## 2011-08-18 DIAGNOSIS — M25669 Stiffness of unspecified knee, not elsewhere classified: Secondary | ICD-10-CM | POA: Insufficient documentation

## 2011-08-18 DIAGNOSIS — M25569 Pain in unspecified knee: Secondary | ICD-10-CM | POA: Insufficient documentation

## 2011-08-20 ENCOUNTER — Ambulatory Visit: Payer: Medicare Other

## 2011-08-26 ENCOUNTER — Ambulatory Visit: Payer: Medicare Other | Admitting: Physical Therapy

## 2011-08-28 ENCOUNTER — Ambulatory Visit: Payer: Medicare Other | Admitting: Physical Therapy

## 2011-09-02 ENCOUNTER — Ambulatory Visit: Payer: Medicare Other

## 2011-09-04 ENCOUNTER — Ambulatory Visit: Payer: Medicare Other

## 2011-09-09 ENCOUNTER — Ambulatory Visit: Payer: Medicare Other

## 2011-09-11 ENCOUNTER — Ambulatory Visit: Payer: Medicare Other

## 2011-10-10 ENCOUNTER — Other Ambulatory Visit: Payer: Self-pay | Admitting: Family Medicine

## 2011-10-10 DIAGNOSIS — Z1231 Encounter for screening mammogram for malignant neoplasm of breast: Secondary | ICD-10-CM

## 2011-10-13 ENCOUNTER — Other Ambulatory Visit (HOSPITAL_COMMUNITY): Payer: Self-pay | Admitting: Family Medicine

## 2011-10-13 DIAGNOSIS — Z1231 Encounter for screening mammogram for malignant neoplasm of breast: Secondary | ICD-10-CM

## 2011-10-29 ENCOUNTER — Ambulatory Visit (HOSPITAL_COMMUNITY)
Admission: RE | Admit: 2011-10-29 | Discharge: 2011-10-29 | Disposition: A | Discharge status: Home / Self Care | Payer: Medicare Other | Source: Ambulatory Visit | Attending: Family Medicine | Admitting: Family Medicine

## 2011-10-29 ENCOUNTER — Encounter (HOSPITAL_COMMUNITY): Payer: Self-pay

## 2011-10-29 DIAGNOSIS — Z1231 Encounter for screening mammogram for malignant neoplasm of breast: Secondary | ICD-10-CM

## 2011-11-06 ENCOUNTER — Ambulatory Visit: Payer: TRICARE For Life (TFL)

## 2011-11-07 ENCOUNTER — Ambulatory Visit: Payer: TRICARE For Life (TFL)

## 2011-11-07 IMAGING — CR DG LUMBAR SPINE COMPLETE 4+V
5 series · 5 of 5 positions shown · non-contrast
Comparison: None.

CLINICAL DATA: Severe right-sided low back pain

LUMBAR SPINE - COMPLETE 4+ VIEW

[t l-spine a.p.]
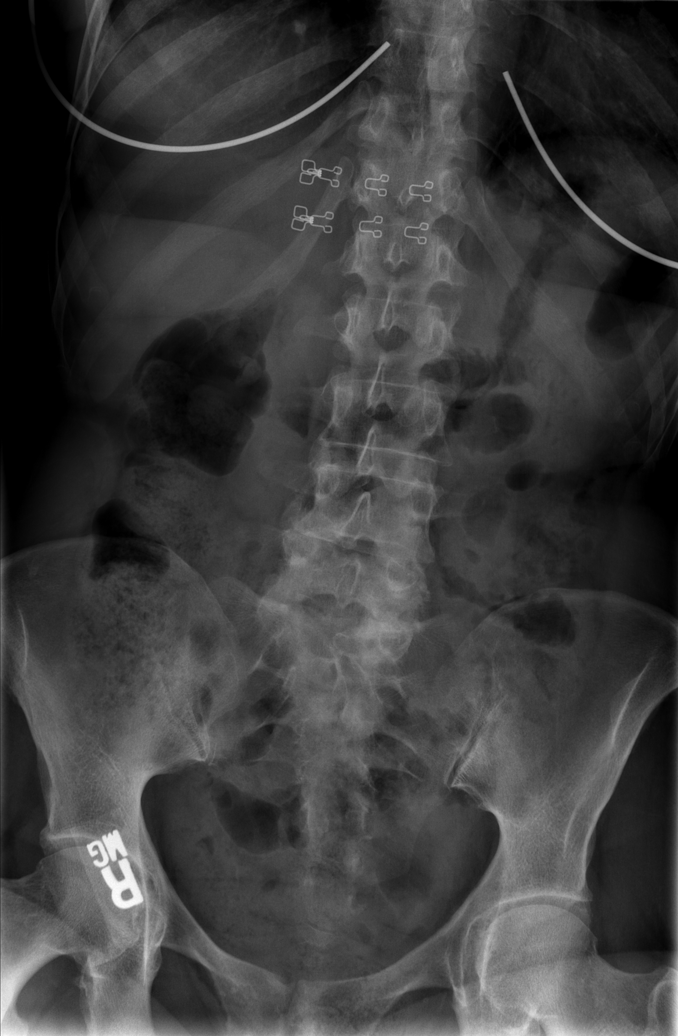

[t l-spine oblique exposure (1 of 2)]
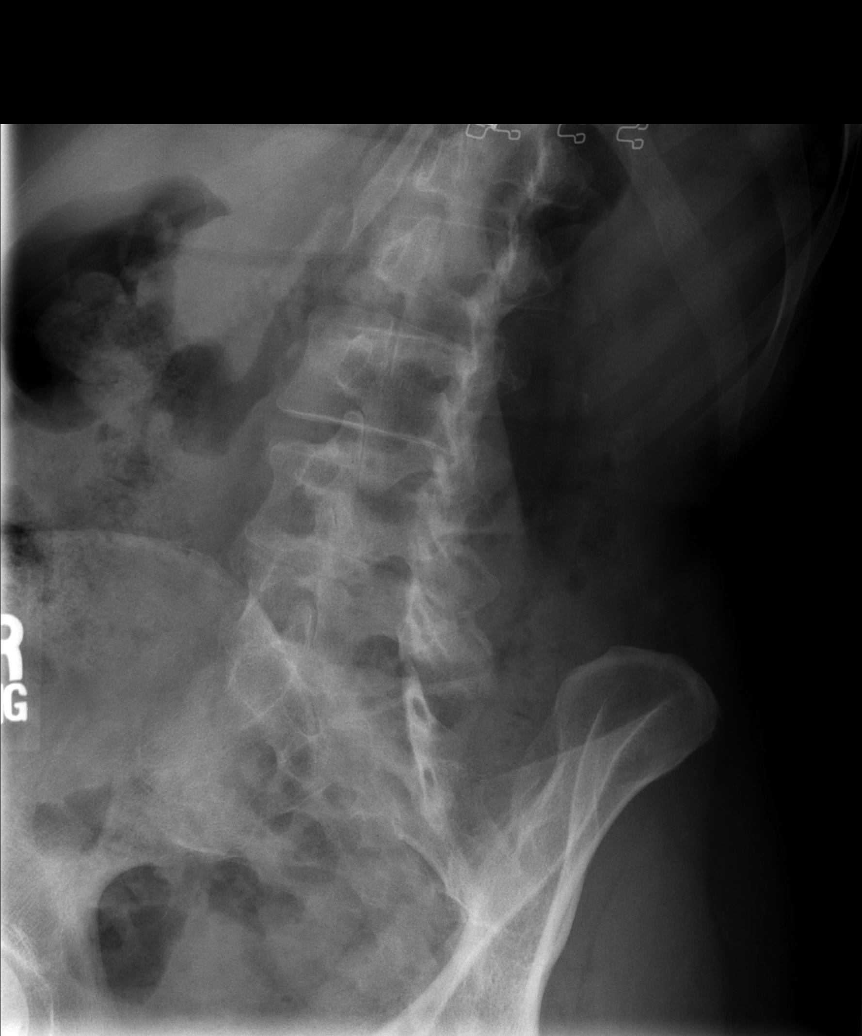

[t l-spine oblique exposure (2 of 2)]
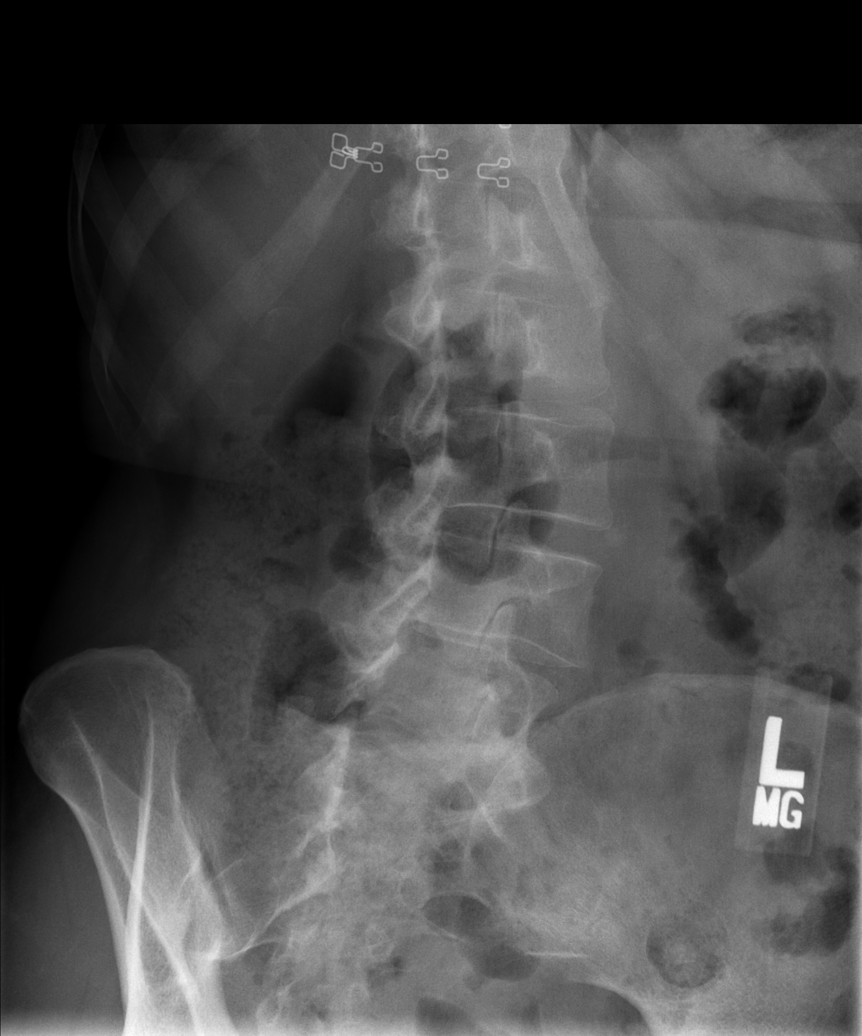

[t l-spine lat]
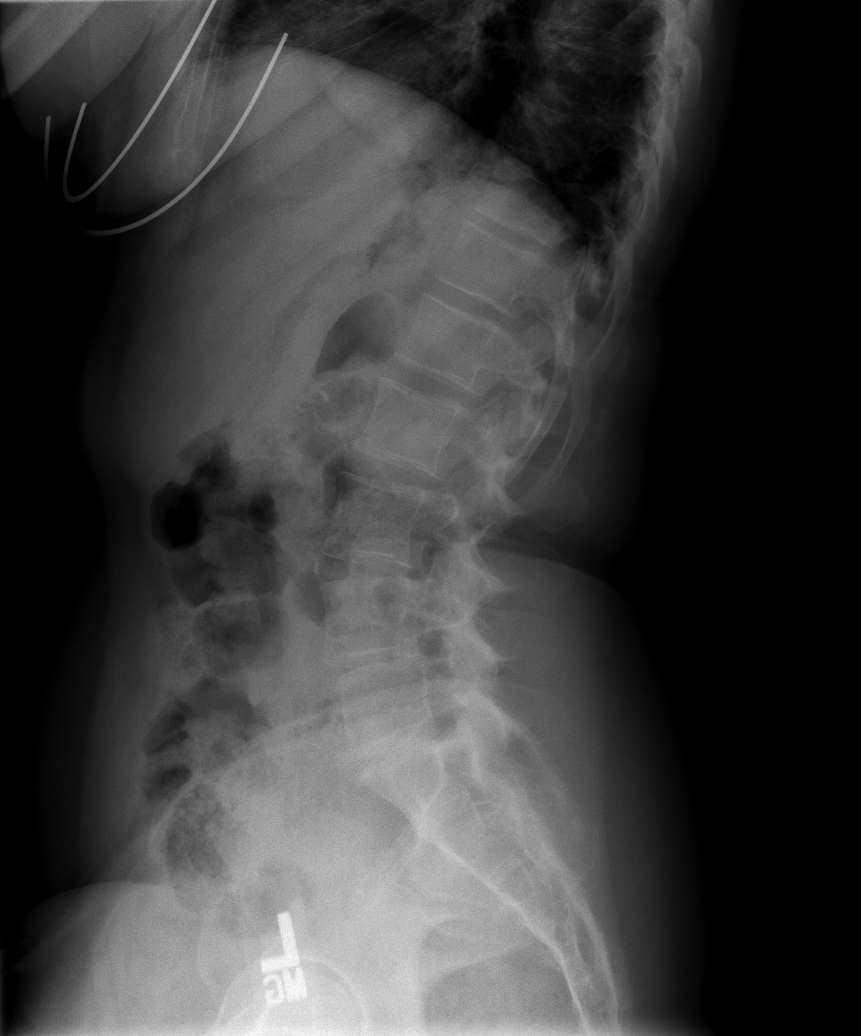

[t l-spine l5-s1 spot]
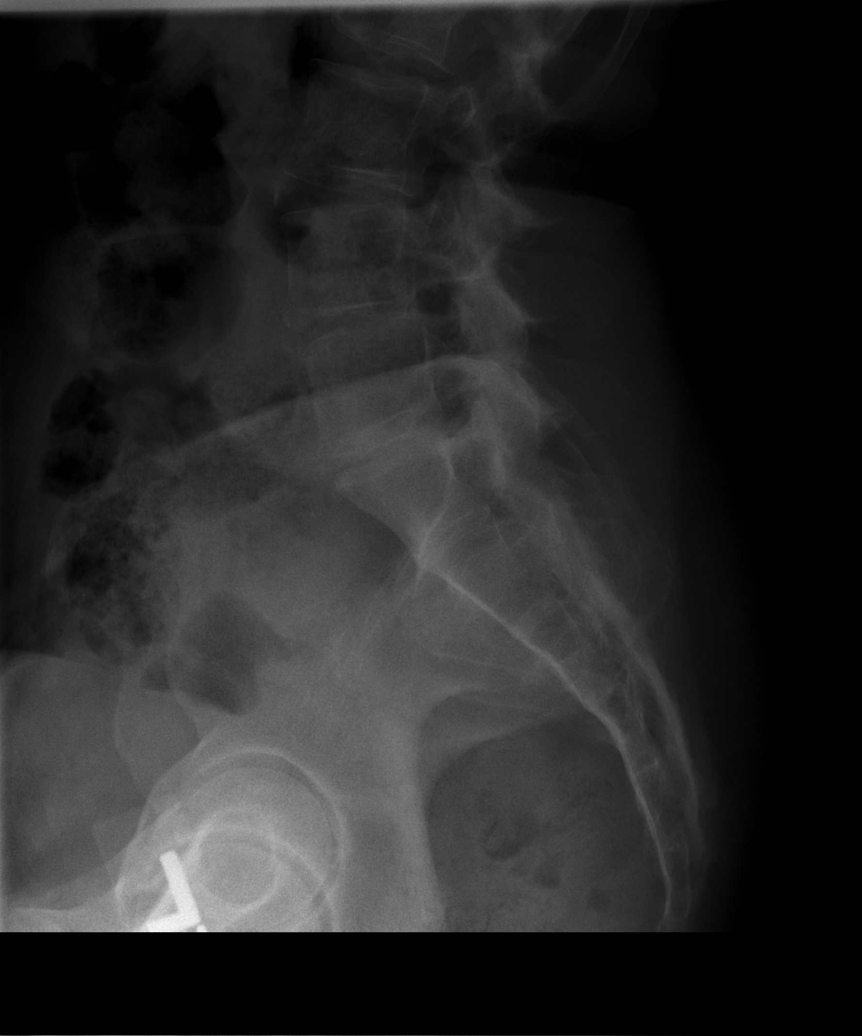

[5 of 5 positions shown; findings below may reference images not displayed]

FINDINGS: Five non-rib bearing lumbar type vertebral bodies are
identified. 3 mm anterolisthesis of L4 on L5 is noted.  No
compression deformity.
IMPRESSION: No acute abnormality.  3 mm anterolisthesis of L4 on L5.

## 2012-10-06 ENCOUNTER — Other Ambulatory Visit (HOSPITAL_COMMUNITY): Payer: Self-pay | Admitting: Family Medicine

## 2012-10-06 DIAGNOSIS — Z1231 Encounter for screening mammogram for malignant neoplasm of breast: Secondary | ICD-10-CM

## 2012-11-02 ENCOUNTER — Ambulatory Visit (HOSPITAL_COMMUNITY)
Admission: RE | Admit: 2012-11-02 | Discharge: 2012-11-02 | Disposition: A | Discharge status: Home / Self Care | Payer: Medicare PPO | Source: Ambulatory Visit | Attending: Family Medicine | Admitting: Family Medicine

## 2012-11-02 DIAGNOSIS — Z1231 Encounter for screening mammogram for malignant neoplasm of breast: Secondary | ICD-10-CM | POA: Insufficient documentation

## 2013-10-18 ENCOUNTER — Other Ambulatory Visit (HOSPITAL_COMMUNITY): Payer: Self-pay | Admitting: Family Medicine

## 2013-10-18 DIAGNOSIS — Z1231 Encounter for screening mammogram for malignant neoplasm of breast: Secondary | ICD-10-CM

## 2013-11-04 ENCOUNTER — Other Ambulatory Visit: Payer: Self-pay | Admitting: Family Medicine

## 2013-11-04 ENCOUNTER — Ambulatory Visit (HOSPITAL_COMMUNITY)
Admission: RE | Admit: 2013-11-04 | Discharge: 2013-11-04 | Disposition: A | Discharge status: Home / Self Care | Payer: Medicare PPO | Source: Ambulatory Visit | Attending: Family Medicine | Admitting: Family Medicine

## 2013-11-04 DIAGNOSIS — Z1231 Encounter for screening mammogram for malignant neoplasm of breast: Secondary | ICD-10-CM

## 2013-11-18 ENCOUNTER — Ambulatory Visit
Admission: RE | Admit: 2013-11-18 | Discharge: 2013-11-18 | Disposition: A | Discharge status: Home / Self Care | Payer: Medicare PPO | Source: Ambulatory Visit | Attending: Family Medicine | Admitting: Family Medicine

## 2013-11-18 ENCOUNTER — Ambulatory Visit: Payer: Medicare Other

## 2013-11-18 DIAGNOSIS — Z1231 Encounter for screening mammogram for malignant neoplasm of breast: Secondary | ICD-10-CM

## 2014-05-12 DIAGNOSIS — I10 Essential (primary) hypertension: Secondary | ICD-10-CM | POA: Diagnosis not present

## 2014-05-12 DIAGNOSIS — E785 Hyperlipidemia, unspecified: Secondary | ICD-10-CM | POA: Diagnosis not present

## 2014-05-12 DIAGNOSIS — Z23 Encounter for immunization: Secondary | ICD-10-CM | POA: Diagnosis not present

## 2014-05-12 DIAGNOSIS — M859 Disorder of bone density and structure, unspecified: Secondary | ICD-10-CM | POA: Diagnosis not present

## 2014-05-12 DIAGNOSIS — Z0000 Encounter for general adult medical examination without abnormal findings: Secondary | ICD-10-CM | POA: Diagnosis not present

## 2014-05-12 DIAGNOSIS — K219 Gastro-esophageal reflux disease without esophagitis: Secondary | ICD-10-CM | POA: Diagnosis not present

## 2014-05-12 DIAGNOSIS — E039 Hypothyroidism, unspecified: Secondary | ICD-10-CM | POA: Diagnosis not present

## 2014-05-12 DIAGNOSIS — Z79899 Other long term (current) drug therapy: Secondary | ICD-10-CM | POA: Diagnosis not present

## 2014-05-12 DIAGNOSIS — N183 Chronic kidney disease, stage 3 (moderate): Secondary | ICD-10-CM | POA: Diagnosis not present

## 2014-08-02 DIAGNOSIS — N042 Nephrotic syndrome with diffuse membranous glomerulonephritis: Secondary | ICD-10-CM | POA: Diagnosis not present

## 2014-08-07 DIAGNOSIS — I129 Hypertensive chronic kidney disease with stage 1 through stage 4 chronic kidney disease, or unspecified chronic kidney disease: Secondary | ICD-10-CM | POA: Diagnosis not present

## 2014-08-07 DIAGNOSIS — N183 Chronic kidney disease, stage 3 (moderate): Secondary | ICD-10-CM | POA: Diagnosis not present

## 2014-10-10 DIAGNOSIS — H903 Sensorineural hearing loss, bilateral: Secondary | ICD-10-CM | POA: Diagnosis not present

## 2014-10-10 DIAGNOSIS — H9202 Otalgia, left ear: Secondary | ICD-10-CM | POA: Diagnosis not present

## 2014-10-10 DIAGNOSIS — H9313 Tinnitus, bilateral: Secondary | ICD-10-CM | POA: Diagnosis not present

## 2014-11-02 DIAGNOSIS — E890 Postprocedural hypothyroidism: Secondary | ICD-10-CM | POA: Diagnosis not present

## 2014-11-07 ENCOUNTER — Other Ambulatory Visit: Payer: Self-pay

## 2014-11-07 DIAGNOSIS — Z1231 Encounter for screening mammogram for malignant neoplasm of breast: Secondary | ICD-10-CM

## 2014-11-07 DIAGNOSIS — L602 Onychogryphosis: Secondary | ICD-10-CM | POA: Diagnosis not present

## 2014-11-07 DIAGNOSIS — E890 Postprocedural hypothyroidism: Secondary | ICD-10-CM | POA: Diagnosis not present

## 2014-11-07 DIAGNOSIS — C73 Malignant neoplasm of thyroid gland: Secondary | ICD-10-CM | POA: Diagnosis not present

## 2014-11-21 ENCOUNTER — Ambulatory Visit
Admission: RE | Admit: 2014-11-21 | Discharge: 2014-11-21 | Disposition: A | Discharge status: Home / Self Care | Payer: Medicare PPO | Source: Ambulatory Visit

## 2014-11-21 DIAGNOSIS — Z1231 Encounter for screening mammogram for malignant neoplasm of breast: Secondary | ICD-10-CM | POA: Diagnosis not present

## 2014-12-04 ENCOUNTER — Encounter (HOSPITAL_COMMUNITY): Payer: Self-pay | Admitting: Orthopedic Surgery

## 2015-10-19 ENCOUNTER — Other Ambulatory Visit: Payer: Self-pay | Admitting: Family Medicine

## 2015-10-19 DIAGNOSIS — Z1231 Encounter for screening mammogram for malignant neoplasm of breast: Secondary | ICD-10-CM

## 2015-11-23 ENCOUNTER — Ambulatory Visit
Admission: RE | Admit: 2015-11-23 | Discharge: 2015-11-23 | Disposition: A | Discharge status: Home / Self Care | Payer: Medicare Other | Source: Ambulatory Visit | Attending: Family Medicine | Admitting: Family Medicine

## 2015-11-23 DIAGNOSIS — Z1231 Encounter for screening mammogram for malignant neoplasm of breast: Secondary | ICD-10-CM

## 2016-07-28 ENCOUNTER — Ambulatory Visit: Payer: Medicare Other | Admitting: Physical Therapy

## 2016-10-14 ENCOUNTER — Other Ambulatory Visit: Payer: Self-pay | Admitting: Family Medicine

## 2016-10-14 DIAGNOSIS — Z1231 Encounter for screening mammogram for malignant neoplasm of breast: Secondary | ICD-10-CM

## 2016-11-25 ENCOUNTER — Ambulatory Visit
Admission: RE | Admit: 2016-11-25 | Discharge: 2016-11-25 | Disposition: A | Discharge status: Home / Self Care | Payer: Medicare Other | Source: Ambulatory Visit | Attending: Family Medicine | Admitting: Family Medicine

## 2016-11-25 DIAGNOSIS — Z1231 Encounter for screening mammogram for malignant neoplasm of breast: Secondary | ICD-10-CM

## 2017-11-02 ENCOUNTER — Other Ambulatory Visit: Payer: Self-pay | Admitting: Family Medicine

## 2017-11-02 DIAGNOSIS — Z1231 Encounter for screening mammogram for malignant neoplasm of breast: Secondary | ICD-10-CM

## 2017-12-07 ENCOUNTER — Ambulatory Visit
Admission: RE | Admit: 2017-12-07 | Discharge: 2017-12-07 | Disposition: A | Discharge status: Home / Self Care | Payer: Medicare Other | Source: Ambulatory Visit | Attending: Family Medicine | Admitting: Family Medicine

## 2017-12-07 DIAGNOSIS — Z1231 Encounter for screening mammogram for malignant neoplasm of breast: Secondary | ICD-10-CM

## 2018-10-20 ENCOUNTER — Other Ambulatory Visit: Payer: Self-pay | Admitting: Family Medicine

## 2018-10-20 DIAGNOSIS — Z1231 Encounter for screening mammogram for malignant neoplasm of breast: Secondary | ICD-10-CM

## 2018-11-30 ENCOUNTER — Ambulatory Visit: Payer: Medicare Other | Admitting: Physical Therapy

## 2018-12-27 ENCOUNTER — Ambulatory Visit: Payer: Medicare Other

## 2019-02-14 ENCOUNTER — Ambulatory Visit: Payer: Medicare Other

## 2019-02-28 ENCOUNTER — Ambulatory Visit: Payer: Medicare Other | Attending: Internal Medicine

## 2019-03-21 ENCOUNTER — Other Ambulatory Visit: Payer: Self-pay

## 2019-03-21 ENCOUNTER — Ambulatory Visit
Admission: RE | Admit: 2019-03-21 | Discharge: 2019-03-21 | Disposition: A | Discharge status: Home / Self Care | Payer: Medicare PPO | Source: Ambulatory Visit | Attending: Family Medicine | Admitting: Family Medicine

## 2019-03-21 DIAGNOSIS — Z1231 Encounter for screening mammogram for malignant neoplasm of breast: Secondary | ICD-10-CM

## 2019-03-23 ENCOUNTER — Ambulatory Visit: Payer: Medicare Other

## 2019-10-29 ENCOUNTER — Ambulatory Visit: Payer: Medicare PPO | Attending: Internal Medicine

## 2019-10-29 DIAGNOSIS — Z23 Encounter for immunization: Secondary | ICD-10-CM

## 2019-10-29 NOTE — Progress Notes (Signed)
   Covid-19 Vaccination Clinic  Name:  Martha [name]    MRN: [id number] DOB: [birth year]  10/29/2019  [name] was observed post Covid-19 immunization for 15 minutes without incident. She was provided with Vaccine Information Sheet and instruction to access the V-Safe system.   [name] was instructed to call 911 with any severe reactions post vaccine: Marland Kitchen Difficulty breathing  . Swelling of face and throat  . A fast heartbeat  . A bad rash all over body  . Dizziness and weakness

## 2020-03-01 ENCOUNTER — Other Ambulatory Visit: Payer: Self-pay | Admitting: Family Medicine

## 2020-03-01 DIAGNOSIS — Z1231 Encounter for screening mammogram for malignant neoplasm of breast: Secondary | ICD-10-CM

## 2020-04-10 DIAGNOSIS — E890 Postprocedural hypothyroidism: Secondary | ICD-10-CM | POA: Diagnosis not present

## 2020-04-10 DIAGNOSIS — C73 Malignant neoplasm of thyroid gland: Secondary | ICD-10-CM | POA: Diagnosis not present

## 2020-04-23 ENCOUNTER — Inpatient Hospital Stay: Admission: RE | Admit: 2020-04-23 | Payer: Medicare PPO | Source: Ambulatory Visit

## 2020-05-16 DIAGNOSIS — H5231 Anisometropia: Secondary | ICD-10-CM | POA: Diagnosis not present

## 2020-05-16 DIAGNOSIS — Z961 Presence of intraocular lens: Secondary | ICD-10-CM | POA: Diagnosis not present

## 2020-05-16 DIAGNOSIS — Z9889 Other specified postprocedural states: Secondary | ICD-10-CM | POA: Diagnosis not present

## 2020-05-16 DIAGNOSIS — Z9842 Cataract extraction status, left eye: Secondary | ICD-10-CM | POA: Diagnosis not present

## 2020-06-13 ENCOUNTER — Ambulatory Visit
Admission: RE | Admit: 2020-06-13 | Discharge: 2020-06-13 | Disposition: A | Discharge status: Home / Self Care | Payer: Medicare PPO | Source: Ambulatory Visit | Attending: Family Medicine | Admitting: Family Medicine

## 2020-06-13 ENCOUNTER — Other Ambulatory Visit: Payer: Self-pay

## 2020-06-13 DIAGNOSIS — Z1231 Encounter for screening mammogram for malignant neoplasm of breast: Secondary | ICD-10-CM

## 2020-08-17 DIAGNOSIS — E559 Vitamin D deficiency, unspecified: Secondary | ICD-10-CM | POA: Diagnosis not present

## 2020-08-17 DIAGNOSIS — Z79899 Other long term (current) drug therapy: Secondary | ICD-10-CM | POA: Diagnosis not present

## 2020-08-17 DIAGNOSIS — E785 Hyperlipidemia, unspecified: Secondary | ICD-10-CM | POA: Diagnosis not present

## 2020-08-17 DIAGNOSIS — Z0000 Encounter for general adult medical examination without abnormal findings: Secondary | ICD-10-CM | POA: Diagnosis not present

## 2020-08-17 DIAGNOSIS — E663 Overweight: Secondary | ICD-10-CM | POA: Diagnosis not present

## 2020-08-17 DIAGNOSIS — Z008 Encounter for other general examination: Secondary | ICD-10-CM | POA: Diagnosis not present

## 2020-08-17 DIAGNOSIS — I10 Essential (primary) hypertension: Secondary | ICD-10-CM | POA: Diagnosis not present

## 2020-08-17 DIAGNOSIS — E890 Postprocedural hypothyroidism: Secondary | ICD-10-CM | POA: Diagnosis not present

## 2020-08-17 DIAGNOSIS — D321 Benign neoplasm of spinal meninges: Secondary | ICD-10-CM | POA: Diagnosis not present

## 2020-11-23 DIAGNOSIS — E890 Postprocedural hypothyroidism: Secondary | ICD-10-CM | POA: Insufficient documentation

## 2020-11-23 DIAGNOSIS — N2581 Secondary hyperparathyroidism of renal origin: Secondary | ICD-10-CM | POA: Diagnosis not present

## 2020-11-23 DIAGNOSIS — Z0000 Encounter for general adult medical examination without abnormal findings: Secondary | ICD-10-CM | POA: Diagnosis not present

## 2020-11-23 DIAGNOSIS — Z136 Encounter for screening for cardiovascular disorders: Secondary | ICD-10-CM | POA: Diagnosis not present

## 2020-11-23 DIAGNOSIS — Z23 Encounter for immunization: Secondary | ICD-10-CM | POA: Diagnosis not present

## 2020-11-23 DIAGNOSIS — Z79899 Other long term (current) drug therapy: Secondary | ICD-10-CM | POA: Diagnosis not present

## 2020-11-23 DIAGNOSIS — I129 Hypertensive chronic kidney disease with stage 1 through stage 4 chronic kidney disease, or unspecified chronic kidney disease: Secondary | ICD-10-CM | POA: Diagnosis not present

## 2020-11-23 DIAGNOSIS — E782 Mixed hyperlipidemia: Secondary | ICD-10-CM | POA: Diagnosis not present

## 2020-11-23 DIAGNOSIS — N1832 Chronic kidney disease, stage 3b: Secondary | ICD-10-CM | POA: Diagnosis not present

## 2020-11-23 DIAGNOSIS — I10 Essential (primary) hypertension: Secondary | ICD-10-CM | POA: Insufficient documentation

## 2020-11-23 DIAGNOSIS — Z0001 Encounter for general adult medical examination with abnormal findings: Secondary | ICD-10-CM | POA: Diagnosis not present

## 2020-12-11 DIAGNOSIS — Z79899 Other long term (current) drug therapy: Secondary | ICD-10-CM | POA: Diagnosis not present

## 2020-12-11 DIAGNOSIS — E039 Hypothyroidism, unspecified: Secondary | ICD-10-CM | POA: Diagnosis not present

## 2020-12-11 DIAGNOSIS — N1832 Chronic kidney disease, stage 3b: Secondary | ICD-10-CM | POA: Diagnosis not present

## 2020-12-11 DIAGNOSIS — N2581 Secondary hyperparathyroidism of renal origin: Secondary | ICD-10-CM | POA: Diagnosis not present

## 2020-12-11 DIAGNOSIS — H11009 Unspecified pterygium of unspecified eye: Secondary | ICD-10-CM | POA: Diagnosis not present

## 2020-12-11 DIAGNOSIS — F330 Major depressive disorder, recurrent, mild: Secondary | ICD-10-CM | POA: Diagnosis not present

## 2020-12-11 DIAGNOSIS — Z0000 Encounter for general adult medical examination without abnormal findings: Secondary | ICD-10-CM | POA: Diagnosis not present

## 2020-12-11 DIAGNOSIS — I10 Essential (primary) hypertension: Secondary | ICD-10-CM | POA: Diagnosis not present

## 2020-12-11 DIAGNOSIS — E785 Hyperlipidemia, unspecified: Secondary | ICD-10-CM | POA: Diagnosis not present

## 2020-12-13 DIAGNOSIS — Z23 Encounter for immunization: Secondary | ICD-10-CM | POA: Diagnosis not present

## 2020-12-18 DIAGNOSIS — I10 Essential (primary) hypertension: Secondary | ICD-10-CM | POA: Diagnosis not present

## 2020-12-18 DIAGNOSIS — C73 Malignant neoplasm of thyroid gland: Secondary | ICD-10-CM | POA: Diagnosis not present

## 2020-12-18 DIAGNOSIS — E890 Postprocedural hypothyroidism: Secondary | ICD-10-CM | POA: Diagnosis not present

## 2021-01-16 DIAGNOSIS — E890 Postprocedural hypothyroidism: Secondary | ICD-10-CM | POA: Diagnosis not present

## 2021-01-21 DIAGNOSIS — R3589 Other polyuria: Secondary | ICD-10-CM | POA: Diagnosis not present

## 2021-01-21 DIAGNOSIS — C73 Malignant neoplasm of thyroid gland: Secondary | ICD-10-CM | POA: Diagnosis not present

## 2021-01-21 DIAGNOSIS — I10 Essential (primary) hypertension: Secondary | ICD-10-CM | POA: Diagnosis not present

## 2021-01-21 DIAGNOSIS — E890 Postprocedural hypothyroidism: Secondary | ICD-10-CM | POA: Diagnosis not present

## 2021-02-18 DIAGNOSIS — N1832 Chronic kidney disease, stage 3b: Secondary | ICD-10-CM | POA: Diagnosis not present

## 2021-02-19 DIAGNOSIS — N1832 Chronic kidney disease, stage 3b: Secondary | ICD-10-CM | POA: Diagnosis not present

## 2021-02-22 ENCOUNTER — Other Ambulatory Visit: Payer: Self-pay

## 2021-02-22 ENCOUNTER — Other Ambulatory Visit (HOSPITAL_BASED_OUTPATIENT_CLINIC_OR_DEPARTMENT_OTHER): Payer: Self-pay

## 2021-02-22 ENCOUNTER — Encounter (HOSPITAL_BASED_OUTPATIENT_CLINIC_OR_DEPARTMENT_OTHER): Payer: Self-pay | Admitting: Cardiology

## 2021-02-22 ENCOUNTER — Ambulatory Visit (INDEPENDENT_AMBULATORY_CARE_PROVIDER_SITE_OTHER): Payer: Medicare PPO | Admitting: Cardiology

## 2021-02-22 VITALS — BP 158/90 | HR 79 | Ht 62.0 in | Wt 136.5 lb

## 2021-02-22 DIAGNOSIS — E890 Postprocedural hypothyroidism: Secondary | ICD-10-CM

## 2021-02-22 DIAGNOSIS — Z7189 Other specified counseling: Secondary | ICD-10-CM

## 2021-02-22 DIAGNOSIS — I10 Essential (primary) hypertension: Secondary | ICD-10-CM

## 2021-02-22 DIAGNOSIS — N184 Chronic kidney disease, stage 4 (severe): Secondary | ICD-10-CM | POA: Insufficient documentation

## 2021-02-22 DIAGNOSIS — E7800 Pure hypercholesterolemia, unspecified: Secondary | ICD-10-CM | POA: Insufficient documentation

## 2021-02-22 DIAGNOSIS — C73 Malignant neoplasm of thyroid gland: Secondary | ICD-10-CM | POA: Insufficient documentation

## 2021-02-22 MED ORDER — AMLODIPINE BESYLATE 5 MG PO TABS
5.0000 mg | ORAL_TABLET | Freq: Every day | ORAL | 11 refills | Status: AC
  Filled 2021-02-22: qty 30, 30d supply, fill #0

## 2021-02-22 NOTE — Progress Notes (Signed)
Cardiology Office Note:    Date:  02/22/2021   ID:  Martha [name], DOB 01-Oct-1935, MRN [id number]  PCP:  Jonathon Jordan, MD  Cardiologist:  Buford Dresser, MD  Referring MD: Jonathon Jordan, MD   CC: new patient evaluation of hypertension and hyperlipidemia  History of Present Illness:    Martha [name] is a 86 y.o. female with a hx of hypertension, hyperlipidemia, and hyperthyroidism s/p thyroidectomy, who is seen as a new consult at the request of Jonathon Jordan, MD for the evaluation and management of hypertension and hyperlipidemia.  Notes from Dr. Stephanie Acre reviewed. I also reviewed available records in [place].   Lipid history: 12/11/20: Tchol 505, LDL calc 388, TG 95, HDL 104, TSH 22.41 (Dr. Stephanie Acre) 11/23/20: Tchol 592, LDL direct 443, TG 142, HDL 110, TSH 28.34 (Care Everywhere) 11/21/19: Tchol 471, LDL calc 368, TG 77, HDL 94, TSH 23.36 (Dr. Stephanie Acre)  Surgical notes from 03/10/2008: thyroid goiter with compressive symptoms, inconclusive cytology from FNA. S/P total resection, pathology showed 0.5 cm focus of papillary thyroid carcinoma, follicular variant with nodular hyperplasia.  Cardiovascular risk factors: Prior clinical ASCVD: none Comorbid conditions: Hypertension - About 1 year. Hyperlipidemia - About 1 year. She has never been told that her cholesterol was as high as her most recent labs. Previously tried medication for cholesterol, she is unsure which. She is currently taking Crestor and Benicar once daily. Metabolic syndrome/Obesity: none Chronic inflammatory conditions: none Tobacco use history: never Family history: To her knowledge no cholesterol issues, no heart attacks or strokes in immediate family. Her mother was on dialysis. 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 Current diet: She has eliminated seasonings  in her meals. Cooks her own food, generally eats health.  At one time she had an episode of pain in her chest that she describes as a sharp pain that "just came and went." This was an isolated incident and has not recurred.  She reports having a history of prolapsed bladder. Also, she endorses vision, hearing, and thyroid issues.  She has been with Dr. Stephanie Acre and Dr. Chalmers Cater for years. In early January, her Synthroid was increased from 88 to 100 mcg.  It is difficult to obtain a complete history/timeline. She recalls being told that she has high cholesterol in the distant past but only recently was started on medication. Similarly, she endorses that her blood pressure was well controlled until recently. I have limited records to assess this, but the information I have dating to 2021 notes that these issues were present at that time.  When she was seen on 11/23/20 in the [facility], her blood pressure was documented as 197/116. There is note of prior BP of 178/88 on 08/29/19.  Previously she tried generic medications and subsequently developed a skin reaction on her back that she describes as "bubbles with water in them." She is only able to tolerate brand name medications.  She denies any palpitations, or shortness of breath. No lightheadedness, headaches, syncope, orthopnea, PND, lower extremity edema or exertional symptoms.  Past Medical History:  Diagnosis Date   Acid indigestion    periodically---takes tums for relief   Hypertension    takes benicar   Hyperthyroidism    thyroidectomy   PONV (postoperative nausea and vomiting)     Past Surgical History:  Procedure Laterality Date   ABDOMINAL HYSTERECTOMY     EYE SURGERY  1972  right eye   goiter     removed   KNEE ARTHROSCOPY  07/24/2011   Procedure: ARTHROSCOPY KNEE;  Surgeon: Sharmon Revere, MD;  Location: [facility];  Service: Orthopedics;  Laterality: Right;   TOTAL THYROIDECTOMY     2010-for goiter with compressive symptoms     Current Medications: Current Outpatient Medications on File Prior to Visit  Medication Sig   b complex vitamins capsule Take 1 capsule by mouth daily.   calcium carbonate (OS-CAL) 600 MG TABS Take 600 mg by mouth 2 (two) times daily with a meal.   cholecalciferol (VITAMIN D) 1000 UNITS tablet Take 1,000 Units by mouth daily.   levothyroxine (SYNTHROID, LEVOTHROID) 100 MCG tablet Take 100 mcg by mouth daily.   Multiple Vitamin (MULTIVITAMIN WITH MINERALS) TABS Take 1 tablet by mouth daily.   olmesartan-hydrochlorothiazide (BENICAR HCT) 20-12.5 MG per tablet Take 1 tablet by mouth daily.   rosuvastatin (CRESTOR) 10 MG tablet Take 10 mg by mouth daily.   No current facility-administered medications on file prior to visit.     Allergies:   Contrast media [iodinated contrast media]   Social History   Tobacco Use   Smoking status: Never   Smokeless tobacco: Never  Substance Use Topics   Alcohol use: No   Drug use: No    Family History: family history includes Kidney disease in her mother.  ROS:   Please see the history of present illness.  Additional pertinent ROS: Constitutional: Negative for chills, fever, night sweats, unintentional weight loss  HENT: Negative for ear pain.   Eyes: Negative for eye pain.  Respiratory: Negative for cough, sputum, wheezing.   Cardiovascular: See HPI. Gastrointestinal: Negative for abdominal pain, melena, and hematochezia.  Genitourinary: Negative for dysuria and hematuria.  Musculoskeletal: Negative for falls and myalgias.  Skin: Negative for itching.  Neurological: Negative for focal weakness, focal sensory changes and loss of consciousness.  Endo/Heme/Allergies: Does not bruise/bleed easily.     EKGs/Labs/Other Studies Reviewed:    The following studies were reviewed today:  CT Chest 07/11/2008: IMPRESSION:  1.  No lung mass demonstrated.  The previously seen left apical  density may have represented overlapping soft tissues.  2.   1.3 cm densely calcified meningioma in the anterior spinal  canal on the right at the T8 level.  This is causing cord  displacement and mild to moderate cord compression.  3.  Multiple small liver cysts.   Left LE Venous Doppler 07/12/2003: Findings:   Left lower extremity deep venous system demonstrates normal compressibility, phasicity, and augmentation. Normal color doppler signal at select levels.    IMPRESSION  No evidence of left lower extremity DVT.     EKG:  EKG is personally reviewed.   02/22/2021: NSR at 79 bpm with PRWP  Recent Labs: No results found for requested labs within last 8760 hours.   Recent Lipid Panel No results found for: CHOL, TRIG, HDL, CHOLHDL, VLDL, LDLCALC, LDLDIRECT  Physical Exam:    VS:  BP (!) 158/90 (BP Location: Right Arm, Patient Position: Sitting, Cuff Size: Normal)    Pulse 79    Ht 5\' 2"  (1.575 m)    Wt 136 lb 8 oz (61.9 kg)    BMI 24.97 kg/m     Wt Readings from Last 3 Encounters:  02/22/21 136 lb 8 oz (61.9 kg)  07/22/11 154 lb 8 oz (70.1 kg)    GEN: Well nourished, well developed in no acute distress HEENT: Normal, moist mucous  membranes NECK: No JVD CARDIAC: regular rhythm, normal S1 and S2, no rubs or gallops. No murmur. VASCULAR: Radial and DP pulses 2+ bilaterally. No carotid bruits RESPIRATORY:  Clear to auscultation without rales, wheezing or rhonchi  ABDOMEN: Soft, non-tender, non-distended MUSCULOSKELETAL:  Ambulates independently SKIN: Warm and dry, no edema NEUROLOGIC:  Alert and oriented x 3. No focal neuro deficits noted. PSYCHIATRIC:  Normal affect    ASSESSMENT:    1. Essential hypertension   2. Pure hypercholesterolemia   3. Postoperative hypothyroidism   4. CKD (chronic kidney disease) stage 4, GFR 15-29 ml/min (HCC)   5. Cardiac risk counseling   6. Counseling on health promotion and disease prevention    PLAN:    Hypertension Stage 4 chronic kidney disease, latest GFR 29 12/11/2020 -I am struggling with  the timeline of her hypertension and kidney disease. There are notes stating she was following with Dr. Posey Pronto at [facility] for hypertensive CKD, which suggests a long term chronic process. However, she tells me this has only been an issue in the last year or so -she is taking only olmesartan-HCTZ 20-12.5 mg daily. With her CKD 4, I am concerned about increasing these medications -we discussed amlodipine today. She notes that she has had issues with generic medications in the past, but she wanted to try generic amlodipine to see if she tolerates it. Will start with 5 mg dose -discussed home BP monitoring  Severely elevated LDL Severely elevated TSH S/P total thyroidectomy 2010, on levothyroxine -this timeline is also unclear to me. Her very elevated, isolated LDL is very concerning for homozygous familial hypercholesterolemia. However, she does not have a strong family history, and at age 42 she has no known ASCVD. No abnormal findings on physical exam -I only have three timepoints for lipids (see above) from 11/2019-11/2020. At all three time points, her TSH was very abnormal -I am unsure as to whether her lipids are improved when her TSH is well controlled. If so, it suggests that recent numbers have been exacerbated by hypothyroidism -if however her numbers are very abnormal with controlled TSH, then this would point more towards a genetic syndrome -I will request more records from Dr. Chalmers Cater and Dr. Stephanie Acre to see if I can better determine the timeline of events -continue rosuvastatin for now, but if LDL remains this elevated once TSH normalizes, will need high intensity statin and [id number] at least for control -counseled on red flag warning signs that need immediate medical attention  Cardiac risk counseling and prevention recommendations: -recommend heart healthy/Mediterranean diet, with whole grains, fruits, vegetable, fish, lean meats, nuts, and olive oil. Limit salt. -recommend moderate  walking, 3-5 times/week for 30-50 minutes each session. Aim for at least 150 minutes.week. Goal should be pace of 3 miles/hours, or walking 1.5 miles in 30 minutes -recommend avoidance of tobacco products. Avoid excess alcohol. -ASCVD risk score: The ASCVD Risk score (Arnett DK, et al., 2019) failed to calculate for the following reasons:   The 2019 ASCVD risk score is only valid for ages 26 to 50    Plan for follow up: 3-4 weeks or sooner as needed.  Total time of encounter: 73 minutes total time of encounter, including 42 minutes spent in face-to-face patient care. This time includes coordination of care and counseling regarding history, hypertension, hypothyroidism, severely elevated LDL. Remainder of non-face-to-face time involved reviewing chart documents/testing relevant to the patient encounter and documentation in the medical record.  Buford Dresser, MD, PhD, [place]  [facility]    Medication Adjustments/Labs and Tests Ordered: Current medicines are reviewed at length with the patient today.  Concerns regarding medicines are outlined above.   Orders Placed This Encounter  Procedures   EKG 12-Lead   Meds ordered this encounter  Medications   amLODipine (NORVASC) 5 MG tablet    Sig: Take 1 tablet (5 mg total) by mouth daily.    Dispense:  30 tablet    Refill:  11   Patient Instructions  Medication Instructions:  1) START: Amlodipine 5 mg   *If you need a refill on your cardiac medications before your next appointment, please call your pharmacy*   Lab Work: None ordered today   Testing/Procedures: None ordered today   Follow-Up: At [facility], you and your health needs are our priority.  As part of our continuing mission to provide you with exceptional heart care, we have created designated Provider Care Teams.  These Care Teams include your primary Cardiologist (physician) and Advanced Practice Providers (APPs -  Physician Assistants and  Nurse Practitioners) who all work together to provide you with the care you need, when you need it.  We recommend signing up for the patient portal called "MyChart".  Sign up information is provided on this After Visit Summary.  MyChart is used to connect with patients for Virtual Visits (Telemedicine).  Patients are able to view lab/test results, encounter notes, upcoming appointments, etc.  Non-urgent messages can be sent to your provider as well.   To learn more about what you can do with MyChart, go to [email or web address].    Your next appointment:   3-4 week(s)  The format for your next appointment:   In Person  Provider:   Buford Dresser, MD       [facility],acting as a scribe for Buford Dresser, MD.,have documented all relevant documentation on the behalf of Buford Dresser, MD,as directed by  Buford Dresser, MD while in the presence of Buford Dresser, MD.  I, Buford Dresser, MD, have reviewed all documentation for this visit. The documentation on 02/22/21 for the exam, diagnosis, procedures, and orders are all accurate and complete.   Signed, Buford Dresser, MD PhD 02/22/2021 8:05 PM    [place]

## 2021-02-22 NOTE — Patient Instructions (Signed)
Medication Instructions:  1) START: Amlodipine 5 mg   *If you need a refill on your cardiac medications before your next appointment, please call your pharmacy*   Lab Work: None ordered today   Testing/Procedures: None ordered today   Follow-Up: At [facility], you and your health needs are our priority.  As part of our continuing mission to provide you with exceptional heart care, we have created designated Provider Care Teams.  These Care Teams include your primary Cardiologist (physician) and Advanced Practice Providers (APPs -  Physician Assistants and Nurse Practitioners) who all work together to provide you with the care you need, when you need it.  We recommend signing up for the patient portal called "MyChart".  Sign up information is provided on this After Visit Summary.  MyChart is used to connect with patients for Virtual Visits (Telemedicine).  Patients are able to view lab/test results, encounter notes, upcoming appointments, etc.  Non-urgent messages can be sent to your provider as well.   To learn more about what you can do with MyChart, go to [email or web address].    Your next appointment:   3-4 week(s)  The format for your next appointment:   In Person  Provider:   Buford Dresser, MD

## 2021-02-26 ENCOUNTER — Encounter (HOSPITAL_BASED_OUTPATIENT_CLINIC_OR_DEPARTMENT_OTHER): Payer: Self-pay

## 2021-02-28 DIAGNOSIS — I129 Hypertensive chronic kidney disease with stage 1 through stage 4 chronic kidney disease, or unspecified chronic kidney disease: Secondary | ICD-10-CM | POA: Diagnosis not present

## 2021-02-28 DIAGNOSIS — N2581 Secondary hyperparathyroidism of renal origin: Secondary | ICD-10-CM | POA: Diagnosis not present

## 2021-02-28 DIAGNOSIS — N1832 Chronic kidney disease, stage 3b: Secondary | ICD-10-CM | POA: Diagnosis not present

## 2021-02-28 DIAGNOSIS — D631 Anemia in chronic kidney disease: Secondary | ICD-10-CM | POA: Diagnosis not present

## 2021-03-07 ENCOUNTER — Other Ambulatory Visit (HOSPITAL_BASED_OUTPATIENT_CLINIC_OR_DEPARTMENT_OTHER): Payer: Self-pay

## 2021-03-07 MED ORDER — LEVOTHYROXINE SODIUM 100 MCG PO TABS
ORAL_TABLET | ORAL | 0 refills | Status: DC
  Filled 2021-03-07: qty 30, 30d supply, fill #0

## 2021-03-21 DIAGNOSIS — E890 Postprocedural hypothyroidism: Secondary | ICD-10-CM | POA: Diagnosis not present

## 2021-03-22 ENCOUNTER — Other Ambulatory Visit (HOSPITAL_BASED_OUTPATIENT_CLINIC_OR_DEPARTMENT_OTHER): Payer: Self-pay

## 2021-03-22 MED ORDER — LEVOTHYROXINE SODIUM 88 MCG PO TABS
ORAL_TABLET | ORAL | 6 refills | Status: AC
  Filled 2021-03-22: qty 30, 30d supply, fill #0
  Filled 2021-08-09: qty 30, 30d supply, fill #1

## 2021-03-25 ENCOUNTER — Ambulatory Visit (INDEPENDENT_AMBULATORY_CARE_PROVIDER_SITE_OTHER): Payer: Medicare PPO | Admitting: Cardiology

## 2021-03-25 ENCOUNTER — Other Ambulatory Visit: Payer: Self-pay

## 2021-03-25 ENCOUNTER — Encounter (HOSPITAL_BASED_OUTPATIENT_CLINIC_OR_DEPARTMENT_OTHER): Payer: Self-pay | Admitting: Cardiology

## 2021-03-25 VITALS — BP 130/72 | HR 87 | Ht 60.0 in | Wt 135.4 lb

## 2021-03-25 DIAGNOSIS — E890 Postprocedural hypothyroidism: Secondary | ICD-10-CM

## 2021-03-25 DIAGNOSIS — N184 Chronic kidney disease, stage 4 (severe): Secondary | ICD-10-CM

## 2021-03-25 DIAGNOSIS — E7800 Pure hypercholesterolemia, unspecified: Secondary | ICD-10-CM | POA: Diagnosis not present

## 2021-03-25 DIAGNOSIS — Z7189 Other specified counseling: Secondary | ICD-10-CM

## 2021-03-25 DIAGNOSIS — I10 Essential (primary) hypertension: Secondary | ICD-10-CM | POA: Diagnosis not present

## 2021-03-25 NOTE — Progress Notes (Signed)
Cardiology Office Note:    Date:  03/25/2021   ID:  Martha [name], DOB 1935-11-30, MRN [id number]  PCP:  Jonathon Jordan, MD  Cardiologist:  Buford Dresser, MD  Referring MD: Jonathon Jordan, MD   CC: follow-up  History of Present Illness:    Martha [name] is a 86 y.o. female with a hx of hypertension, hyperlipidemia, and hyperthyroidism s/p thyroidectomy, who is seen for follow-up. She was initially seen 02/22/21 as a new consult at the request of Jonathon Jordan, MD for the evaluation and management of hypertension and hyperlipidemia.  Lipid history: 12/11/20: Tchol 505, LDL calc 388, TG 95, HDL 104, TSH 22.41 (Dr. Stephanie Acre) 11/23/20: Tchol 592, LDL direct 443, TG 142, HDL 110, TSH 28.34 (Care Everywhere) 11/21/19: Tchol 471, LDL calc 368, TG 77, HDL 94, TSH 23.36 (Dr. Stephanie Acre)  Surgical notes from 03/10/2008: thyroid goiter with compressive symptoms, inconclusive cytology from FNA. S/P total resection, pathology showed 0.5 cm focus of papillary thyroid carcinoma, follicular variant with nodular hyperplasia.  Today: She is not doing well. Her R knee pain and swelling has been bothering her. She uses a pain cream at night to mediate the pain.   For the past week, she reports an episode of facial swelling occuring around her nose and under her bilateral eyes that radiated to her ears. She initially noticed the swelling after she looked in the mirror.  She records her blood pressure at home and has not had episodes of significantly high or low pressures.   She denies any palpitations, or shortness of breath. No lightheadedness, headaches, syncope, orthopnea, PND, or exertional symptoms.  Past Medical History:  Diagnosis Date   Acid indigestion    periodically---takes tums for relief   Hypertension    takes benicar   Hyperthyroidism    thyroidectomy   PONV (postoperative nausea and vomiting)     Past Surgical History:  Procedure Laterality Date   ABDOMINAL  HYSTERECTOMY     EYE SURGERY     1972  right eye   goiter     removed   KNEE ARTHROSCOPY  07/24/2011   Procedure: ARTHROSCOPY KNEE;  Surgeon: Sharmon Revere, MD;  Location: [facility];  Service: Orthopedics;  Laterality: Right;   TOTAL THYROIDECTOMY     2010-for goiter with compressive symptoms    Current Medications: Current Outpatient Medications on File Prior to Visit  Medication Sig   amLODipine (NORVASC) 5 MG tablet Take 1 tablet (5 mg total) by mouth daily.   b complex vitamins capsule Take 1 capsule by mouth daily.   calcium carbonate (OS-CAL) 600 MG TABS Take 600 mg by mouth 2 (two) times daily with a meal.   cholecalciferol (VITAMIN D) 1000 UNITS tablet Take 1,000 Units by mouth daily.   levothyroxine (SYNTHROID) 100 MCG tablet 1 tablet in the morning on an empty stomach Orally Once a day 30 days   levothyroxine (SYNTHROID) 88 MCG tablet 1 tablet in the morning on an empty stomach Orally Once a day 30 day(s)   levothyroxine (SYNTHROID, LEVOTHROID) 100 MCG tablet Take 100 mcg by mouth daily.   Multiple Vitamin (MULTIVITAMIN WITH MINERALS) TABS Take 1 tablet by mouth daily.   olmesartan-hydrochlorothiazide (BENICAR HCT) 20-12.5 MG per tablet Take 1 tablet by mouth daily.   rosuvastatin (CRESTOR) 10 MG tablet Take 10 mg by mouth daily.   No current facility-administered medications on file prior to visit.     Allergies:   Contrast media [iodinated contrast media]   Social  History   Tobacco Use   Smoking status: Never   Smokeless tobacco: Never  Substance Use Topics   Alcohol use: No   Drug use: No    Family History: family history includes Kidney disease in her mother.  ROS:   Please see the history of present illness.  Additional pertinent ROS: (+) R knee pain (+) R knee swelling (+) Facial swelling  EKGs/Labs/Other Studies Reviewed:    The following studies were reviewed today: CT Chest 07/11/2008: IMPRESSION:  1.  No lung mass demonstrated.  The previously  seen left apical  density may have represented overlapping soft tissues.  2.  1.3 cm densely calcified meningioma in the anterior spinal  canal on the right at the T8 level.  This is causing cord  displacement and mild to moderate cord compression.  3.  Multiple small liver cysts.   Left LE Venous Doppler 07/12/2003: Findings:   Left lower extremity deep venous system demonstrates normal compressibility, phasicity, and augmentation. Normal color doppler signal at select levels.    IMPRESSION  No evidence of left lower extremity DVT.     EKG:  EKG personally reviewed 03/25/21: ECG was not ordered today  02/22/2021: NSR at 79 bpm with PRWP  Recent Labs: No results found for requested labs within last 8760 hours.   Recent Lipid Panel No results found for: CHOL, TRIG, HDL, CHOLHDL, VLDL, LDLCALC, LDLDIRECT  Physical Exam:    VS:  BP 130/72    Pulse 87    Ht 5' (1.524 m)    Wt 135 lb 6.4 oz (61.4 kg)    SpO2 98%    BMI 26.44 kg/m     Wt Readings from Last 3 Encounters:  03/25/21 135 lb 6.4 oz (61.4 kg)  02/22/21 136 lb 8 oz (61.9 kg)  07/22/11 154 lb 8 oz (70.1 kg)    GEN: Well nourished, well developed in no acute distress HEENT: Normal, moist mucous membranes NECK: No JVD CARDIAC: regular rhythm, normal S1 and S2, no rubs or gallops. No murmur. VASCULAR: Radial and DP pulses 2+ bilaterally. No carotid bruits RESPIRATORY:  Clear to auscultation without rales, wheezing or rhonchi  ABDOMEN: Soft, non-tender, non-distended MUSCULOSKELETAL:  Ambulates independently SKIN: Warm and dry, no edema NEUROLOGIC:  Alert and oriented x 3. No focal neuro deficits noted. PSYCHIATRIC:  Normal affect    ASSESSMENT:    1. Pure hypercholesterolemia   2. Postoperative hypothyroidism   3. Essential hypertension   4. CKD (chronic kidney disease) stage 4, GFR 15-29 ml/min (HCC)   5. Cardiac risk counseling   6. Counseling on health promotion and disease prevention     PLAN:     Hypertension Stage 4 chronic kidney disease, latest GFR 29 12/11/2020 -unclear timeline of her hypertension and kidney disease. There are notes stating she was following with Dr. Posey Pronto at [facility] for hypertensive CKD, which suggests a long term chronic process. However, she tells me this has only been an issue in the last year or so -on olmesartan-HCTZ 20-12.5 mg daily.  -started amlodipine 5 mg dose at last visit, tolerating well with improvement in BP  Severely elevated LDL Severely elevated TSH S/P total thyroidectomy 2010, on levothyroxine -see my initial comments on this. -now that recent TSH improved, will recheck lipids (will get TSH same day to have comparison). This will help determine if her lipids were more due to her thyroid issues or if this is more consistent with a genetic syndrome -continue rosuvastatin for now,  but if LDL remains this elevated once TSH normalizes, will need high intensity statin and PCSK9i at least for control -counseled on red flag warning signs that need immediate medical attention  Cardiac risk counseling and prevention recommendations: -recommend heart healthy/Mediterranean diet, with whole grains, fruits, vegetable, fish, lean meats, nuts, and olive oil. Limit salt. -recommend moderate walking, 3-5 times/week for 30-50 minutes each session. Aim for at least 150 minutes.week. Goal should be pace of 3 miles/hours, or walking 1.5 miles in 30 minutes -recommend avoidance of tobacco products. Avoid excess alcohol. -ASCVD risk score: The ASCVD Risk score (Arnett DK, et al., 2019) failed to calculate for the following reasons:   The 2019 ASCVD risk score is only valid for ages 37 to 62    Plan for follow up: 1 year (depending on lab work) or sooner as needed.  Buford Dresser, MD, PhD, [facility]    Medication Adjustments/Labs and Tests Ordered: Current medicines are reviewed at length with the patient today.   Concerns regarding medicines are outlined above.   Orders Placed This Encounter  Procedures   TSH   Lipid Profile   No orders of the defined types were placed in this encounter.  Patient Instructions  Medication Instructions:  Your Physician recommend you continue on your current medication as directed.    *If you need a refill on your cardiac medications before your next appointment, please call your pharmacy*   Lab Work: We are going to check your thyroid (TSH) and cholesterol today since you did not have breakfast or lunch. These have been abnormal, but if your thyroid is better then hopefully your cholesterol will be as well. If cholesterol is good, we will see you back in a year. If it's still very abnormal, we will bring you back sooner.  If you have labs (blood work) drawn today and your tests are completely normal, you will receive your results only by: [place] (if you have MyChart) OR A paper copy in the mail If you have any lab test that is abnormal or we need to change your treatment, we will call you to review the results.   Testing/Procedures: None ordered today   Follow-Up: At [facility], you and your health needs are our priority.  As part of our continuing mission to provide you with exceptional heart care, we have created designated Provider Care Teams.  These Care Teams include your primary Cardiologist (physician) and Advanced Practice Providers (APPs -  Physician Assistants and Nurse Practitioners) who all work together to provide you with the care you need, when you need it.  We recommend signing up for the patient portal called "MyChart".  Sign up information is provided on this After Visit Summary.  MyChart is used to connect with patients for Virtual Visits (Telemedicine).  Patients are able to view lab/test results, encounter notes, upcoming appointments, etc.  Non-urgent messages can be sent to your provider as well.   To learn more about what  you can do with MyChart, go to [email or web address].    Your next appointment:   1 year(s)  The format for your next appointment:   In Person  Provider:   Buford Dresser, MD{    Wilhemina Bonito as a scribe for Buford Dresser, MD.,have documented all relevant documentation on the behalf of Buford Dresser, MD,as directed by  Buford Dresser, MD while in the presence of Buford Dresser, MD.  I, Buford Dresser, MD, have reviewed all documentation for this  visit. The documentation on 03/25/21 for the exam, diagnosis, procedures, and orders are all accurate and complete.   Signed, Buford Dresser, MD PhD 03/25/2021 6:42 PM    [facility]

## 2021-03-25 NOTE — Patient Instructions (Signed)
Medication Instructions:  ?Your Physician recommend you continue on your current medication as directed.   ? ?*If you need a refill on your cardiac medications before your next appointment, please call your pharmacy* ? ? ?Lab Work: ?We are going to check your thyroid (TSH) and cholesterol today since you did not have breakfast or lunch. These have been abnormal, but if your thyroid is better then hopefully your cholesterol will be as well. If cholesterol is good, we will see you back in a year. If it's still very abnormal, we will bring you back sooner. ? ?If you have labs (blood work) drawn today and your tests are completely normal, you will receive your results only by: ?MyChart Message (if you have MyChart) OR ?A paper copy in the mail ?If you have any lab test that is abnormal or we need to change your treatment, we will call you to review the results. ? ? ?Testing/Procedures: ?None ordered today ? ? ?Follow-Up: ?At [facility], you and your health needs are our priority.  As part of our continuing mission to provide you with exceptional heart care, we have created designated Provider Care Teams.  These Care Teams include your primary Cardiologist (physician) and Advanced Practice Providers (APPs -  Physician Assistants and Nurse Practitioners) who all work together to provide you with the care you need, when you need it. ? ?We recommend signing up for the patient portal called "MyChart".  Sign up information is provided on this After Visit Summary.  MyChart is used to connect with patients for Virtual Visits (Telemedicine).  Patients are able to view lab/test results, encounter notes, upcoming appointments, etc.  Non-urgent messages can be sent to your provider as well.   ?To learn more about what you can do with MyChart, go to [email or web address].   ? ?Your next appointment:   ?1 year(s) ? ?The format for your next appointment:   ?In Person ? ?Provider:   ?Buford Dresser, MD{ ? ? ?

## 2021-03-26 LAB — LIPID PANEL
Chol/HDL Ratio: 3.6 ratio (ref 0.0–4.4)
Cholesterol, Total: 333 mg/dL — ABNORMAL HIGH (ref 100–199)
HDL: 93 mg/dL (ref >39)
LDL Chol Calc (NIH): 219 mg/dL — ABNORMAL HIGH (ref 0–99)
Triglycerides: 120 mg/dL (ref 0–149)
VLDL Cholesterol Cal: 21 mg/dL (ref 5–40)

## 2021-03-26 LAB — TSH: TSH: 2.73 u[IU]/mL (ref 0.450–4.500)

## 2021-04-12 ENCOUNTER — Other Ambulatory Visit: Payer: Self-pay | Admitting: Family Medicine

## 2021-04-12 DIAGNOSIS — Z1231 Encounter for screening mammogram for malignant neoplasm of breast: Secondary | ICD-10-CM

## 2021-04-16 ENCOUNTER — Other Ambulatory Visit (HOSPITAL_BASED_OUTPATIENT_CLINIC_OR_DEPARTMENT_OTHER): Payer: Self-pay

## 2021-04-16 DIAGNOSIS — E7800 Pure hypercholesterolemia, unspecified: Secondary | ICD-10-CM

## 2021-04-16 MED ORDER — ROSUVASTATIN CALCIUM 20 MG PO TABS
20.0000 mg | ORAL_TABLET | Freq: Every day | ORAL | 3 refills | Status: AC
Start: 2021-04-16 — End: ?
  Filled 2021-04-16: qty 90, 90d supply, fill #0

## 2021-06-14 ENCOUNTER — Other Ambulatory Visit (HOSPITAL_BASED_OUTPATIENT_CLINIC_OR_DEPARTMENT_OTHER): Payer: Self-pay

## 2021-06-14 ENCOUNTER — Ambulatory Visit
Admission: RE | Admit: 2021-06-14 | Discharge: 2021-06-14 | Disposition: A | Discharge status: Home / Self Care | Payer: PPO | Source: Ambulatory Visit | Attending: Family Medicine | Admitting: Family Medicine

## 2021-06-14 DIAGNOSIS — R198 Other specified symptoms and signs involving the digestive system and abdomen: Secondary | ICD-10-CM | POA: Diagnosis not present

## 2021-06-14 DIAGNOSIS — Z1231 Encounter for screening mammogram for malignant neoplasm of breast: Secondary | ICD-10-CM

## 2021-06-14 DIAGNOSIS — N8110 Cystocele, unspecified: Secondary | ICD-10-CM | POA: Diagnosis not present

## 2021-06-14 DIAGNOSIS — K649 Unspecified hemorrhoids: Secondary | ICD-10-CM | POA: Diagnosis not present

## 2021-06-14 MED ORDER — HYDROCORTISONE ACETATE 25 MG RE SUPP
RECTAL | 1 refills | Status: AC
  Filled 2021-06-14 – 2021-06-26 (×2): qty 14, 14d supply, fill #0

## 2021-06-26 ENCOUNTER — Other Ambulatory Visit (HOSPITAL_BASED_OUTPATIENT_CLINIC_OR_DEPARTMENT_OTHER): Payer: Self-pay

## 2021-07-29 DIAGNOSIS — N8111 Cystocele, midline: Secondary | ICD-10-CM | POA: Diagnosis not present

## 2021-07-29 DIAGNOSIS — R3915 Urgency of urination: Secondary | ICD-10-CM | POA: Diagnosis not present

## 2021-08-09 ENCOUNTER — Other Ambulatory Visit (HOSPITAL_BASED_OUTPATIENT_CLINIC_OR_DEPARTMENT_OTHER): Payer: Self-pay

## 2021-08-12 DIAGNOSIS — N8111 Cystocele, midline: Secondary | ICD-10-CM | POA: Diagnosis not present

## 2021-08-12 DIAGNOSIS — N3941 Urge incontinence: Secondary | ICD-10-CM | POA: Diagnosis not present

## 2021-08-27 DIAGNOSIS — N3941 Urge incontinence: Secondary | ICD-10-CM | POA: Diagnosis not present

## 2021-08-27 DIAGNOSIS — N8110 Cystocele, unspecified: Secondary | ICD-10-CM | POA: Diagnosis not present

## 2021-08-27 DIAGNOSIS — M6281 Muscle weakness (generalized): Secondary | ICD-10-CM | POA: Diagnosis not present

## 2021-08-27 DIAGNOSIS — M6289 Other specified disorders of muscle: Secondary | ICD-10-CM | POA: Diagnosis not present

## 2021-09-10 DIAGNOSIS — N3941 Urge incontinence: Secondary | ICD-10-CM | POA: Diagnosis not present

## 2021-09-10 DIAGNOSIS — R3915 Urgency of urination: Secondary | ICD-10-CM | POA: Diagnosis not present

## 2021-09-17 DIAGNOSIS — N8111 Cystocele, midline: Secondary | ICD-10-CM | POA: Diagnosis not present

## 2021-09-17 DIAGNOSIS — R3915 Urgency of urination: Secondary | ICD-10-CM | POA: Diagnosis not present

## 2021-09-19 ENCOUNTER — Other Ambulatory Visit: Payer: Self-pay | Admitting: Urology

## 2021-09-24 DIAGNOSIS — Z23 Encounter for immunization: Secondary | ICD-10-CM | POA: Diagnosis not present

## 2021-10-08 ENCOUNTER — Encounter (HOSPITAL_BASED_OUTPATIENT_CLINIC_OR_DEPARTMENT_OTHER): Payer: Self-pay | Admitting: Internal Medicine

## 2021-10-08 ENCOUNTER — Ambulatory Visit (INDEPENDENT_AMBULATORY_CARE_PROVIDER_SITE_OTHER): Payer: Medicare PPO | Admitting: Internal Medicine

## 2021-10-08 VITALS — BP 139/82 | HR 72 | Ht 60.0 in | Wt 137.2 lb

## 2021-10-08 DIAGNOSIS — E7849 Other hyperlipidemia: Secondary | ICD-10-CM

## 2021-10-08 DIAGNOSIS — E890 Postprocedural hypothyroidism: Secondary | ICD-10-CM | POA: Diagnosis not present

## 2021-10-08 DIAGNOSIS — N1831 Chronic kidney disease, stage 3a: Secondary | ICD-10-CM

## 2021-10-08 DIAGNOSIS — I10 Essential (primary) hypertension: Secondary | ICD-10-CM | POA: Diagnosis not present

## 2021-10-08 NOTE — Patient Instructions (Signed)
Medication Instructions:  NO CHANGES today   *If you need a refill on your cardiac medications before your next appointment, please call your pharmacy*   Lab Work: NMR lipoprofile and LPa today   If you have labs (blood work) drawn today and your tests are completely normal, you will receive your results only by: [place] (if you have MyChart) OR A paper copy in the mail If you have any lab test that is abnormal or we need to change your treatment, we will call you to review the results.   Testing/Procedures: Dr. Debara Pickett has ordered a CT coronary calcium score.   Test locations:  [place]   This is $99 out of pocket.   Coronary CalciumScan A coronary calcium scan is an imaging test used to look for deposits of calcium and other fatty materials (plaques) in the inner lining of the blood vessels of the heart (coronary arteries). These deposits of calcium and plaques can partly clog and narrow the coronary arteries without producing any symptoms or warning signs. This puts a person at risk for a heart attack. This test can detect these deposits before symptoms develop. Tell a health care provider about: Any allergies you have. All medicines you are taking, including vitamins, herbs, eye drops, creams, and over-the-counter medicines. Any problems you or family members have had with anesthetic medicines. Any blood disorders you have. Any surgeries you have had. Any medical conditions you have. Whether you are pregnant or may be pregnant. What are the risks? Generally, this is a safe procedure. However, problems may occur, including: Harm to a pregnant woman and her unborn baby. This test involves the use of radiation. Radiation exposure can be dangerous to a pregnant woman and her unborn baby. If you are pregnant, you generally should not have this procedure done. Slight increase in the risk of cancer. This is because of the radiation involved in  the test. What happens before the procedure? No preparation is needed for this procedure. What happens during the procedure? You will undress and remove any jewelry around your neck or chest. You will put on a hospital gown. Sticky electrodes will be placed on your chest. The electrodes will be connected to an electrocardiogram (ECG) machine to record a tracing of the electrical activity of your heart. A CT scanner will take pictures of your heart. During this time, you will be asked to lie still and hold your breath for 2-3 seconds while a picture of your heart is being taken. The procedure may vary among health care providers and hospitals. What happens after the procedure? You can get dressed. You can return to your normal activities. It is up to you to get the results of your test. Ask your health care provider, or the department that is doing the test, when your results will be ready. Summary A coronary calcium scan is an imaging test used to look for deposits of calcium and other fatty materials (plaques) in the inner lining of the blood vessels of the heart (coronary arteries). Generally, this is a safe procedure. Tell your health care provider if you are pregnant or may be pregnant. No preparation is needed for this procedure. A CT scanner will take pictures of your heart. You can return to your normal activities after the scan is done. This information is not intended to replace advice given to you by your health care provider. Make sure you discuss any questions you have with your health care provider.  Document Released: 06/28/2007 Document Revised: 11/19/2015 Document Reviewed: 11/19/2015 Elsevier Interactive Patient Education  2017 [place]: At [facility], you and your health needs are our priority.  As part of our continuing mission to provide you with exceptional heart care, we have created designated Provider Care Teams.  These Care Teams include  your primary Cardiologist (physician) and Advanced Practice Providers (APPs -  Physician Assistants and Nurse Practitioners) who all work together to provide you with the care you need, when you need it.  We recommend signing up for the patient portal called "MyChart".  Sign up information is provided on this After Visit Summary.  MyChart is used to connect with patients for Virtual Visits (Telemedicine).  Patients are able to view lab/test results, encounter notes, upcoming appointments, etc.  Non-urgent messages can be sent to your provider as well.   To learn more about what you can do with MyChart, go to [email or web address].    Your next appointment:    3-4 months with Dr. Debara Pickett

## 2021-10-08 NOTE — Progress Notes (Signed)
LIPID CLINIC CONSULT NOTE  Chief Complaint:  Familial hyperlipidemia  Primary Care Physician: Jonathon Jordan, MD  Primary Cardiologist:  Buford Dresser, MD  HPI:  Martha [name] is a 86 y.o. female who is being seen today for the evaluation of familial hyperlipidemia at the request of Buford Dresser,*. This is a pleasant female kindly referred for severe hyperlipidemia.  She was recently seen by Dr. Harrell Gave for evaluation management of dyslipidemia and hypertension.  Previous cholesterol has been significantly elevated, particularly back in 2021 and 2022 with total cholesterol as high as 592, direct LDL 443, triglycerides 142 and HDL 110.  At the time however she had poorly controlled hypothyroidism with TSH in the 22-28 range.  Subsequently her thyroid medication regimen has been adjusted by Dr. Chalmers Cater and she was started on rosuvastatin 10 mg daily.  This is resulted in improvement in her lipids.  Her cholesterol as of March 2023 was total of 333, triglycerides 120, HDL 93 and LDL 219.  While this is significantly improved, did suggest that there was a combination of probable familial hyperlipidemia with a secondary hyperlipidemia.  That being said, it is unknown if she has any history of heart disease.  She is 60, going on 74 in January.  She does report some heart disease in her father who was in his 76s.  PMHx:  Past Medical History:  Diagnosis Date   Acid indigestion    periodically---takes tums for relief   Hypertension    takes benicar   Hyperthyroidism    thyroidectomy   PONV (postoperative nausea and vomiting)     Past Surgical History:  Procedure Laterality Date   ABDOMINAL HYSTERECTOMY     EYE SURGERY     1972  right eye   goiter     removed   KNEE ARTHROSCOPY  07/24/2011   Procedure: ARTHROSCOPY KNEE;  Surgeon: Sharmon Revere, MD;  Location: [facility];  Service: Orthopedics;  Laterality: Right;   TOTAL THYROIDECTOMY     2010-for goiter with  compressive symptoms    FAMHx:  Family History  Problem Relation Age of Onset   Kidney disease Mother     SOCHx:   reports that she has never smoked. She has never used smokeless tobacco. She reports that she does not drink alcohol and does not use drugs.  ALLERGIES:  Allergies  Allergen Reactions   Rosuvastatin Itching    GENERIC CRESTOR   Celecoxib     Other reaction(s): Other (See Comments)   Contrast Media [Iodinated Contrast Media] Hives and Nausea And Vomiting   Levothyroxine Itching   Olmesartan     Other reaction(s): Other (See Comments) Just generic. Brand okay.    Latex Rash    Other reaction(s): Other (See Comments)    ROS: Pertinent items noted in HPI and remainder of comprehensive ROS otherwise negative.  HOME MEDS: Current Outpatient Medications on File Prior to Visit  Medication Sig Dispense Refill   amLODipine (NORVASC) 5 MG tablet Take 1 tablet (5 mg total) by mouth daily. 30 tablet 11   b complex vitamins capsule Take 1 capsule by mouth daily.     calcium carbonate (OS-CAL) 600 MG TABS Take 600 mg by mouth 2 (two) times daily with a meal.     cholecalciferol (VITAMIN D) 1000 UNITS tablet Take 1,000 Units by mouth daily.     hydrocortisone (ANUSOL-HC) 25 MG suppository 1 suppository Rectal Once a day at bedtime 14 days 14 suppository 1   levothyroxine (SYNTHROID)  88 MCG tablet 1 tablet in the morning on an empty stomach Orally Once a day 30 day(s) 30 tablet 6   Multiple Vitamin (MULTIVITAMIN WITH MINERALS) TABS Take 1 tablet by mouth daily.     olmesartan-hydrochlorothiazide (BENICAR HCT) 20-12.5 MG per tablet Take 1 tablet by mouth daily.     rosuvastatin (CRESTOR) 20 MG tablet Take 1 tablet (20 mg total) by mouth daily. 90 tablet 3   No current facility-administered medications on file prior to visit.    LABS/IMAGING: No results found for this or any previous visit (from the past 48 hour(s)). No results found.  LIPID PANEL:    Component Value  Date/Time   CHOL 333 (H) 03/25/2021 1506   TRIG 120 03/25/2021 1506   HDL 93 03/25/2021 1506   CHOLHDL 3.6 03/25/2021 1506   LDLCALC 219 (H) 03/25/2021 1506    WEIGHTS: Wt Readings from Last 3 Encounters:  10/08/21 137 lb 3.2 oz (62.2 kg)  03/25/21 135 lb 6.4 oz (61.4 kg)  02/22/21 136 lb 8 oz (61.9 kg)    VITALS: BP 139/82   Pulse 72   Ht 5' (1.524 m)   Wt 137 lb 3.2 oz (62.2 kg)   SpO2 98%   BMI 26.80 kg/m   EXAM: General appearance: alert and no distress Lungs: clear to auscultation bilaterally Heart: regular rate and rhythm, S1, S2 normal, no murmur, click, rub or gallop Extremities: extremities normal, atraumatic, no cyanosis or edema Neurologic: Grossly normal  EKG: Deferred  ASSESSMENT: Probable familial hyperlipidemia Secondary hyperlipidemia, due to hypothyroidism Hypothyroidism-controlled\ CKD 3B Hypertension  PLAN: 1.   [name] probably has a familial hyperlipidemia.  I also think she had a secondary hyperlipidemia which is related to hypothyroidism.  After treatment of this and normalization of her TSH which recently was 2.73, her lipids have improved.  She is also on rosuvastatin which was increased from 10 to 20 mg.  She will need a repeat lipid profile now as its been 6 months since his dose change and we will look at her NMR as well as LP(a).  I what I do not know is whether or not she has any cardiovascular disease.  I think would be helpful to get a calcium score to better restratify her.  If her calcium score is low or 0, would suggest she may have a lower risk familial hyperlipidemia and I would likely be more conservative about her care.  Otherwise, she would be a good candidate for PCSK9 inhibitor.  If her calcium score is extremely elevated, we should consider functional testing prior to her going into her laparoscopic sacrocolpopexy which is scheduled in late October with Dr. Louis Meckel.  I will defer to Dr. Harrell Gave for the need for preoperative  evaluation.  Thanks again for the kind of referral.  Further suggestions of management based on those findings.  Pixie Casino, MD, [facility], [place] Director of the Advanced Lipid Disorders &  Cardiovascular Risk Reduction Clinic Diplomate of the American Board of Clinical Lipidology Attending Cardiologist  Direct Dial: [phone]  Fax: [phone]  Website:  [email or web address]  Nadean Corwin Mitchelle Goerner 10/08/2021, 10:29 AM

## 2021-10-09 LAB — NMR, LIPOPROFILE
Cholesterol, Total: 396 mg/dL — ABNORMAL HIGH (ref 100–199)
HDL Particle Number: 33.8 umol/L (ref >=30.5)
HDL-C: 88 mg/dL (ref >39)
LDL Particle Number: 2612 nmol/L — ABNORMAL HIGH (ref <1000)
LDL Size: 22.4 nm (ref >20.5)
LDL-C (NIH Calc): 301 mg/dL — ABNORMAL HIGH (ref 0–99)
LP-IR Score: <25 (ref <=45)
Small LDL Particle Number: <90 nmol/L (ref <=527)
Triglycerides: 63 mg/dL (ref 0–149)

## 2021-10-09 LAB — LIPOPROTEIN A (LPA): Lipoprotein (a): 306 nmol/L — ABNORMAL HIGH (ref <75.0)

## 2021-10-14 ENCOUNTER — Telehealth: Payer: Self-pay | Admitting: Internal Medicine

## 2021-10-14 NOTE — Telephone Encounter (Signed)
Pt. Following up regarding her results

## 2021-10-14 NOTE — Telephone Encounter (Signed)
Pt is requesting call back in regards to results she received last week. She states she has concerns and would like to discuss them.

## 2021-10-15 ENCOUNTER — Telehealth (HOSPITAL_BASED_OUTPATIENT_CLINIC_OR_DEPARTMENT_OTHER): Payer: Self-pay | Admitting: Internal Medicine

## 2021-10-15 NOTE — Telephone Encounter (Signed)
Spoke with daughter (15 minutes) about injectable cholesterol medication(s) -- PCSK9i and Leqvio. Daughter expressed concern about her mother being able to do injection as she has no assistance at home with them. She will discuss with her mother (patient) and follow up.   Also, she has surgery on 10/27 and daughter said 11/1 calc score appt may not be the best. Message sent to [facility] to reschedule

## 2021-10-15 NOTE — Telephone Encounter (Signed)
Spoke with patient. She said her daughter Martha [name] called in inquiring about her (patient) lab results and recommendations. She was unable to speak with clinical provider about this as no DPR on file. Patient gave verbal OK to speak with her daughter now - advised will call her. She will go to [facility] to fill out DPR form for future encounter.   [id number] is still is discussion as patient wants daughter to be aware of the labs and recommendations.

## 2021-10-15 NOTE — Telephone Encounter (Signed)
Spoke with Sharyn Lull regarding updated appointment date and time for CT Cardiac Scoring----Friday 11/22/21 at 2:00pm at [facility]---arrival time is 1:45 pm ground floor for check in.  Sharyn Lull voiced her understanding.

## 2021-10-17 DIAGNOSIS — N183 Chronic kidney disease, stage 3 unspecified: Secondary | ICD-10-CM | POA: Diagnosis not present

## 2021-10-17 DIAGNOSIS — Z79899 Other long term (current) drug therapy: Secondary | ICD-10-CM | POA: Diagnosis not present

## 2021-10-17 DIAGNOSIS — N2581 Secondary hyperparathyroidism of renal origin: Secondary | ICD-10-CM | POA: Diagnosis not present

## 2021-10-17 DIAGNOSIS — I10 Essential (primary) hypertension: Secondary | ICD-10-CM | POA: Diagnosis not present

## 2021-10-17 DIAGNOSIS — D7589 Other specified diseases of blood and blood-forming organs: Secondary | ICD-10-CM | POA: Diagnosis not present

## 2021-10-17 DIAGNOSIS — I129 Hypertensive chronic kidney disease with stage 1 through stage 4 chronic kidney disease, or unspecified chronic kidney disease: Secondary | ICD-10-CM | POA: Diagnosis not present

## 2021-10-17 DIAGNOSIS — Z01818 Encounter for other preprocedural examination: Secondary | ICD-10-CM | POA: Diagnosis not present

## 2021-10-24 ENCOUNTER — Other Ambulatory Visit (HOSPITAL_COMMUNITY): Payer: PPO

## 2021-10-28 DIAGNOSIS — N8111 Cystocele, midline: Secondary | ICD-10-CM | POA: Diagnosis not present

## 2021-10-28 NOTE — Patient Instructions (Signed)
SURGICAL WAITING ROOM VISITATION Patients having surgery or a procedure may have no more than 2 support people in the waiting area - these visitors may rotate.   Children under the age of 37 must have an adult with them who is not the patient. If the patient needs to stay at the hospital during part of their recovery, the visitor guidelines for inpatient rooms apply. Pre-op nurse will coordinate an appropriate time for 1 support person to accompany patient in pre-op.  This support person may not rotate.    Please refer to the [facility] website for the visitor guidelines for Inpatients (after your surgery is over and you are in a regular room).     Your procedure is scheduled on: 11/08/21   Report to [facility] Main Entrance    Report to admitting at 5:15 AM   Call this number if you have problems the morning of surgery [phone]   Do not eat food or drink liquids :After Midnight.          If you have questions, please contact your surgeon's office.   FOLLOW BOWEL PREP AND ANY ADDITIONAL PRE OP INSTRUCTIONS YOU RECEIVED FROM YOUR SURGEON'S OFFICE!!!     Oral Hygiene is also important to reduce your risk of infection.                                    Remember - BRUSH YOUR TEETH THE MORNING OF SURGERY WITH YOUR REGULAR TOOTHPASTE   Take these medicines the morning of surgery with A SIP OF WATER: Synthroid, Rosuvastatin                               You may not have any metal on your body including hair pins, jewelry, and body piercing             Do not wear make-up, lotions, powders, perfumes, or deodorant  Do not wear nail polish including gel and S&S, artificial/acrylic nails, or any other type of covering on natural nails including finger and toenails. If you have artificial nails, gel coating, etc. that needs to be removed by a nail salon please have this removed prior to surgery or surgery may need to be canceled/ delayed if the surgeon/ anesthesia feels like they  are unable to be safely monitored.   Do not shave  48 hours prior to surgery.    Do not bring valuables to the hospital. [place].   Bring small overnight bag day of surgery.   DO NOT [place]. PHARMACY WILL DISPENSE MEDICATIONS LISTED ON YOUR MEDICATION LIST TO YOU DURING YOUR ADMISSION [place]!              Please read over the following fact sheets you were given: IF [place] [phone] Schneiders   If you received a COVID test during your pre-op visit  it is requested that you wear a mask when out in public, stay away from anyone that may not be feeling well and notify your surgeon if you develop symptoms. If you test positive for Covid or have been in contact with anyone that has tested positive in the last 10  days please notify you surgeon.     [place] - Preparing for Surgery Before surgery, you can play an important role.  Because skin is not sterile, your skin needs to be as free of germs as possible.  You can reduce the number of germs on your skin by washing with CHG (chlorahexidine gluconate) soap before surgery.  CHG is an antiseptic cleaner which kills germs and bonds with the skin to continue killing germs even after washing. Please DO NOT use if you have an allergy to CHG or antibacterial soaps.  If your skin becomes reddened/irritated stop using the CHG and inform your nurse when you arrive at Short Stay. Do not shave (including legs and underarms) for at least 48 hours prior to the first CHG shower.  You may shave your face/neck.  Please follow these instructions carefully:  1.  Shower with CHG Soap the night before surgery and the  morning of surgery.  2.  If you choose to wash your hair, wash your hair first as usual with your normal  shampoo.  3.  After you shampoo, rinse your hair and body thoroughly to remove the shampoo.                              4.  Use CHG as you would any other liquid soap.  You can apply chg directly to the skin and wash.  Gently with a scrungie or clean washcloth.  5.  Apply the CHG Soap to your body ONLY FROM THE NECK DOWN.   Do   not use on face/ open                           Wound or open sores. Avoid contact with eyes, ears mouth and   genitals (private parts).                       Wash face,  Genitals (private parts) with your normal soap.             6.  Wash thoroughly, paying special attention to the area where your    surgery  will be performed.  7.  Thoroughly rinse your body with warm water from the neck down.  8.  DO NOT shower/wash with your normal soap after using and rinsing off the CHG Soap.                9.  Pat yourself dry with a clean towel.            10.  Wear clean pajamas.            11.  Place clean sheets on your bed the night of your first shower and do not  sleep with pets. Day of Surgery : Do not apply any lotions/deodorants the morning of surgery.  Please wear clean clothes to the hospital/surgery center.  FAILURE TO FOLLOW THESE INSTRUCTIONS MAY RESULT IN THE CANCELLATION OF YOUR SURGERY  PATIENT SIGNATURE_________________________________  NURSE SIGNATURE__________________________________  ________________________________________________________________________   WHAT IS A BLOOD TRANSFUSION? Blood Transfusion Information  A transfusion is the replacement of blood or some of its parts. Blood is made up of multiple cells which provide different functions. Red blood cells carry oxygen and are used for blood loss replacement. White blood cells fight against infection. Platelets control bleeding. Plasma helps clot blood. Other blood products are  available for specialized needs, such as hemophilia or other clotting disorders. BEFORE THE TRANSFUSION  Who gives blood for transfusions?  Healthy volunteers who are fully evaluated to make sure their blood  is safe. This is blood bank blood. Transfusion therapy is the safest it has ever been in the practice of medicine. Before blood is taken from a donor, a complete history is taken to make sure that person has no history of diseases nor engages in risky social behavior (examples are intravenous drug use or sexual activity with multiple partners). The donor's travel history is screened to minimize risk of transmitting infections, such as malaria. The donated blood is tested for signs of infectious diseases, such as HIV and hepatitis. The blood is then tested to be sure it is compatible with you in order to minimize the chance of a transfusion reaction. If you or a relative donates blood, this is often done in anticipation of surgery and is not appropriate for emergency situations. It takes many days to process the donated blood. RISKS AND COMPLICATIONS Although transfusion therapy is very safe and saves many lives, the main dangers of transfusion include:  Getting an infectious disease. Developing a transfusion reaction. This is an allergic reaction to something in the blood you were given. Every precaution is taken to prevent this. The decision to have a blood transfusion has been considered carefully by your caregiver before blood is given. Blood is not given unless the benefits outweigh the risks. AFTER THE TRANSFUSION Right after receiving a blood transfusion, you will usually feel much better and more energetic. This is especially true if your red blood cells have gotten low (anemic). The transfusion raises the level of the red blood cells which carry oxygen, and this usually causes an energy increase. The nurse administering the transfusion will monitor you carefully for complications. HOME CARE INSTRUCTIONS  No special instructions are needed after a transfusion. You may find your energy is better. Speak with your caregiver about any limitations on activity for underlying diseases you may have. SEEK  MEDICAL CARE IF:  Your condition is not improving after your transfusion. You develop redness or irritation at the intravenous (IV) site. SEEK IMMEDIATE MEDICAL CARE IF:  Any of the following symptoms occur over the next 12 hours: Shaking chills. You have a temperature by mouth above 102 F (38.9 C), not controlled by medicine. Chest, back, or muscle pain. People around you feel you are not acting correctly or are confused. Shortness of breath or difficulty breathing. Dizziness and fainting. You get a rash or develop hives. You have a decrease in urine output. Your urine turns a dark color or changes to pink, red, or [place]. Any of the following symptoms occur over the next 10 days: You have a temperature by mouth above 102 F (38.9 C), not controlled by medicine. Shortness of breath. Weakness after normal activity. The white part of the eye turns yellow (jaundice). You have a decrease in the amount of urine or are urinating less often. Your urine turns a dark color or changes to pink, red, or [place]. Document Released: 12/28/1999 Document Revised: 03/24/2011 Document Reviewed: 08/16/2007 [facility] Patient Information 2014 [place], [place].  _______________________________________________________________________

## 2021-10-28 NOTE — Progress Notes (Signed)
Patient did not sign blood or medical consent as she wants to talk with surgeon first.  COVID Vaccine Completed: yes  Date of COVID positive in last 90 days: no  PCP - Jonathon Jordan, MD Cardiologist - Buford Dresser, MD  Chest x-ray - dr Alpha Gula EKG - 02/22/21 Epic Stress Test - n/a ECHO - n/a Cardiac Cath - n/a Pacemaker/ICD device last checked: n/a Spinal Cord Stimulator: n/a  Bowel Prep - fleet enema, pt still needs to get. Instructed her to call surgeons office  Sleep Study - n/a CPAP -   Fasting Blood Sugar - n/a Checks Blood Sugar _____ times a day  Blood Thinner Instructions: n/a Aspirin Instructions: Last Dose:  Activity level: Can go up a flight of stairs and perform activities of daily living without stopping and without symptoms of chest pain or shortness of breath.   Anesthesia review: K+ 5.6, HTN, CKD  Patient denies shortness of breath, fever, cough and chest pain at PAT appointment  Patient verbalized understanding of instructions that were given to them at the PAT appointment. Patient was also instructed that they will need to review over the PAT instructions again at home before surgery.

## 2021-10-29 ENCOUNTER — Encounter (HOSPITAL_COMMUNITY)
Admission: RE | Admit: 2021-10-29 | Discharge: 2021-10-29 | Disposition: A | Discharge status: Home / Self Care | Payer: Medicare PPO | Source: Ambulatory Visit | Attending: Urology | Admitting: Urology

## 2021-10-29 ENCOUNTER — Encounter (HOSPITAL_COMMUNITY): Payer: Self-pay

## 2021-10-29 DIAGNOSIS — Z01812 Encounter for preprocedural laboratory examination: Secondary | ICD-10-CM | POA: Insufficient documentation

## 2021-10-29 HISTORY — DX: Unspecified osteoarthritis, unspecified site: M19.90

## 2021-10-29 LAB — CBC
HCT: 42.7 % (ref 36.0–46.0)
Hemoglobin: 13.8 g/dL (ref 12.0–15.0)
MCH: 33.3 pg (ref 26.0–34.0)
MCHC: 32.3 g/dL (ref 30.0–36.0)
MCV: 102.9 fL — ABNORMAL HIGH (ref 80.0–100.0)
Platelets: 186 10*3/uL (ref 150–400)
RBC: 4.15 MIL/uL (ref 3.87–5.11)
RDW: 14.4 % (ref 11.5–15.5)
WBC: 4.8 10*3/uL (ref 4.0–10.5)
nRBC: 0 % (ref 0.0–0.2)

## 2021-10-29 LAB — BASIC METABOLIC PANEL
Anion gap: 8 (ref 5–15)
BUN: 26 mg/dL — ABNORMAL HIGH (ref 8–23)
CO2: 27 mmol/L (ref 22–32)
Calcium: 8.7 mg/dL — ABNORMAL LOW (ref 8.9–10.3)
Chloride: 103 mmol/L (ref 98–111)
Creatinine, Ser: 1.36 mg/dL — ABNORMAL HIGH (ref 0.44–1.00)
GFR, Estimated: 38 mL/min — ABNORMAL LOW (ref >60)
Glucose, Bld: 87 mg/dL (ref 70–99)
Potassium: 5.6 mmol/L — ABNORMAL HIGH (ref 3.5–5.1)
Sodium: 138 mmol/L (ref 135–145)

## 2021-10-29 NOTE — Progress Notes (Signed)
K+ 5.6, results routed to Dr. Louis Meckel

## 2021-10-30 LAB — URINE CULTURE: Culture: NO GROWTH

## 2021-11-07 ENCOUNTER — Encounter (HOSPITAL_COMMUNITY): Payer: Self-pay | Admitting: Urology

## 2021-11-07 NOTE — Anesthesia Preprocedure Evaluation (Addendum)
Anesthesia Evaluation  Patient identified by MRN, date of birth, ID band Patient awake    Reviewed: Allergy & Precautions, NPO status , Patient's Chart, lab work & pertinent test results  History of Anesthesia Complications (+) PONV and history of anesthetic complications  Airway Mallampati: III  TM Distance: >3 FB Neck ROM: Limited    Dental  (+) Teeth Intact, Dental Advisory Given   Pulmonary neg pulmonary ROS,    breath sounds clear to auscultation       Cardiovascular hypertension, Pt. on medications  Rhythm:Regular Rate:Normal     Neuro/Psych negative neurological ROS  negative psych ROS   GI/Hepatic negative GI ROS, Neg liver ROS,   Endo/Other  Hypothyroidism   Renal/GU Renal disease     Musculoskeletal   Abdominal Normal abdominal exam  (+)   Peds  Hematology   Anesthesia Other Findings   Reproductive/Obstetrics                           Anesthesia Physical Anesthesia Plan  ASA: 2  Anesthesia Plan: General   Post-op Pain Management:    Induction: Intravenous  PONV Risk Score and Plan: 4 or greater and Ondansetron and Treatment may vary due to age or medical condition  Airway Management Planned: Oral ETT  Additional Equipment:   Intra-op Plan:   Post-operative Plan: Extubation in OR  Informed Consent: I have reviewed the patients History and Physical, chart, labs and discussed the procedure including the risks, benefits and alternatives for the proposed anesthesia with the patient or authorized representative who has indicated his/her understanding and acceptance.     Dental advisory given  Plan Discussed with:   Anesthesia Plan Comments: (- 2 IV's)      Anesthesia Quick Evaluation

## 2021-11-08 ENCOUNTER — Other Ambulatory Visit (HOSPITAL_BASED_OUTPATIENT_CLINIC_OR_DEPARTMENT_OTHER): Payer: Self-pay

## 2021-11-08 ENCOUNTER — Encounter (HOSPITAL_COMMUNITY)
Admission: RE | Disposition: A | Discharge status: Home / Self Care | Payer: Self-pay | Source: Ambulatory Visit | Attending: Urology

## 2021-11-08 ENCOUNTER — Encounter (HOSPITAL_COMMUNITY): Payer: Self-pay | Admitting: Urology

## 2021-11-08 ENCOUNTER — Other Ambulatory Visit: Payer: Self-pay

## 2021-11-08 ENCOUNTER — Ambulatory Visit (HOSPITAL_BASED_OUTPATIENT_CLINIC_OR_DEPARTMENT_OTHER): Payer: Medicare PPO | Admitting: Anesthesiology

## 2021-11-08 ENCOUNTER — Ambulatory Visit (HOSPITAL_COMMUNITY): Payer: Medicare PPO | Admitting: Physician Assistant

## 2021-11-08 ENCOUNTER — Observation Stay (HOSPITAL_COMMUNITY)
Admission: RE | Admit: 2021-11-08 | Discharge: 2021-11-09 | Disposition: A | Discharge status: Home / Self Care | Payer: Medicare PPO | Source: Ambulatory Visit | Attending: Urology | Admitting: Urology

## 2021-11-08 DIAGNOSIS — Z91040 Latex allergy status: Secondary | ICD-10-CM | POA: Insufficient documentation

## 2021-11-08 DIAGNOSIS — Z79899 Other long term (current) drug therapy: Secondary | ICD-10-CM | POA: Insufficient documentation

## 2021-11-08 DIAGNOSIS — I10 Essential (primary) hypertension: Secondary | ICD-10-CM | POA: Diagnosis not present

## 2021-11-08 DIAGNOSIS — N819 Female genital prolapse, unspecified: Secondary | ICD-10-CM | POA: Diagnosis not present

## 2021-11-08 DIAGNOSIS — N8189 Other female genital prolapse: Secondary | ICD-10-CM | POA: Diagnosis not present

## 2021-11-08 DIAGNOSIS — N993 Prolapse of vaginal vault after hysterectomy: Secondary | ICD-10-CM | POA: Diagnosis not present

## 2021-11-08 HISTORY — PX: ROBOTIC ASSISTED LAPAROSCOPIC SACROCOLPOPEXY: SHX5388

## 2021-11-08 LAB — HEMOGLOBIN AND HEMATOCRIT, BLOOD
HCT: 34.8 % — ABNORMAL LOW (ref 36.0–46.0)
Hemoglobin: 11.6 g/dL — ABNORMAL LOW (ref 12.0–15.0)

## 2021-11-08 LAB — ABO/RH: ABO/RH(D): A POS

## 2021-11-08 LAB — TYPE AND SCREEN
ABO/RH(D): A POS
Antibody Screen: NEGATIVE

## 2021-11-08 SURGERY — SACROCOLPOPEXY, ROBOT-ASSISTED, LAPAROSCOPIC
Anesthesia: General

## 2021-11-08 MED ORDER — ONDANSETRON HCL 4 MG/2ML IJ SOLN
4.0000 mg | INTRAMUSCULAR | Status: DC | PRN
  Administered 2021-11-08: 4 mg via INTRAVENOUS
  Filled 2021-11-08: qty 2

## 2021-11-08 MED ORDER — STERILE WATER FOR IRRIGATION IR SOLN
Status: DC | PRN
  Administered 2021-11-08: 1000 mL

## 2021-11-08 MED ORDER — HYDRALAZINE HCL 20 MG/ML IJ SOLN: 5.0000 mg | INTRAMUSCULAR | Status: DC | PRN

## 2021-11-08 MED ORDER — ROCURONIUM BROMIDE 10 MG/ML (PF) SYRINGE
PREFILLED_SYRINGE | INTRAVENOUS | Status: DC | PRN
  Administered 2021-11-08: 20 mg via INTRAVENOUS
  Administered 2021-11-08: 50 mg via INTRAVENOUS
  Administered 2021-11-08: 10 mg via INTRAVENOUS

## 2021-11-08 MED ORDER — TRAMADOL HCL 50 MG PO TABS
50.0000 mg | ORAL_TABLET | Freq: Two times a day (BID) | ORAL | Status: DC | PRN
  Administered 2021-11-08: 50 mg via ORAL
  Filled 2021-11-08 (×2): qty 1

## 2021-11-08 MED ORDER — CEFAZOLIN SODIUM-DEXTROSE 2-4 GM/100ML-% IV SOLN
2.0000 g | INTRAVENOUS | Status: AC
  Administered 2021-11-08: 2 g via INTRAVENOUS
  Filled 2021-11-08: qty 100

## 2021-11-08 MED ORDER — HYDRALAZINE HCL 20 MG/ML IJ SOLN
10.0000 mg | Freq: Once | INTRAMUSCULAR | Status: AC
  Administered 2021-11-08: 10 mg via INTRAVENOUS

## 2021-11-08 MED ORDER — HEPARIN SODIUM (PORCINE) 5000 UNIT/ML IJ SOLN
5000.0000 [IU] | Freq: Three times a day (TID) | INTRAMUSCULAR | Status: DC
  Administered 2021-11-08 – 2021-11-09 (×3): 5000 [IU] via SUBCUTANEOUS
  Filled 2021-11-08 (×3): qty 1

## 2021-11-08 MED ORDER — LACTATED RINGERS IV SOLN: INTRAVENOUS | Status: DC | PRN

## 2021-11-08 MED ORDER — ROCURONIUM BROMIDE 10 MG/ML (PF) SYRINGE
PREFILLED_SYRINGE | INTRAVENOUS | Status: AC
  Filled 2021-11-08: qty 10

## 2021-11-08 MED ORDER — ACETAMINOPHEN 10 MG/ML IV SOLN
1000.0000 mg | Freq: Four times a day (QID) | INTRAVENOUS | Status: AC
  Administered 2021-11-08 – 2021-11-09 (×3): 1000 mg via INTRAVENOUS
  Filled 2021-11-08 (×3): qty 100

## 2021-11-08 MED ORDER — FENTANYL CITRATE PF 50 MCG/ML IJ SOSY
PREFILLED_SYRINGE | INTRAMUSCULAR | Status: AC
  Administered 2021-11-08: 50 ug via INTRAVENOUS
  Filled 2021-11-08: qty 3

## 2021-11-08 MED ORDER — ONDANSETRON HCL 4 MG/2ML IJ SOLN
INTRAMUSCULAR | Status: DC | PRN
  Administered 2021-11-08: 4 mg via INTRAVENOUS

## 2021-11-08 MED ORDER — ORAL CARE MOUTH RINSE: 15.0000 mL | Freq: Once | OROMUCOSAL | Status: AC

## 2021-11-08 MED ORDER — LACTATED RINGERS IR SOLN
Status: DC | PRN
  Administered 2021-11-08: 1000 mL

## 2021-11-08 MED ORDER — DEXAMETHASONE SODIUM PHOSPHATE 10 MG/ML IJ SOLN
INTRAMUSCULAR | Status: DC | PRN
  Administered 2021-11-08: 10 mg via INTRAVENOUS

## 2021-11-08 MED ORDER — LACTATED RINGERS IV SOLN: INTRAVENOUS | Status: DC

## 2021-11-08 MED ORDER — HYDRALAZINE HCL 20 MG/ML IJ SOLN
INTRAMUSCULAR | Status: AC
  Filled 2021-11-08: qty 1

## 2021-11-08 MED ORDER — FENTANYL CITRATE (PF) 100 MCG/2ML IJ SOLN
INTRAMUSCULAR | Status: DC | PRN
  Administered 2021-11-08 (×2): 25 ug via INTRAVENOUS
  Administered 2021-11-08: 50 ug via INTRAVENOUS

## 2021-11-08 MED ORDER — PROPOFOL 10 MG/ML IV BOLUS
INTRAVENOUS | Status: AC
  Filled 2021-11-08: qty 20

## 2021-11-08 MED ORDER — ESTRADIOL 0.1 MG/GM VA CREA
TOPICAL_CREAM | VAGINAL | Status: DC | PRN
  Administered 2021-11-08: 1 via VAGINAL

## 2021-11-08 MED ORDER — DOCUSATE SODIUM 100 MG PO CAPS
100.0000 mg | ORAL_CAPSULE | Freq: Two times a day (BID) | ORAL | Status: DC
  Administered 2021-11-08 – 2021-11-09 (×2): 100 mg via ORAL
  Filled 2021-11-08 (×2): qty 1

## 2021-11-08 MED ORDER — ROSUVASTATIN CALCIUM 20 MG PO TABS
20.0000 mg | ORAL_TABLET | Freq: Every day | ORAL | Status: DC
  Administered 2021-11-09: 20 mg via ORAL
  Filled 2021-11-08: qty 1

## 2021-11-08 MED ORDER — SUGAMMADEX SODIUM 200 MG/2ML IV SOLN
INTRAVENOUS | Status: DC | PRN
  Administered 2021-11-08: 150 mg via INTRAVENOUS

## 2021-11-08 MED ORDER — BUPIVACAINE LIPOSOME 1.3 % IJ SUSP
INTRAMUSCULAR | Status: AC
  Filled 2021-11-08: qty 20

## 2021-11-08 MED ORDER — CHLORHEXIDINE GLUCONATE 0.12 % MT SOLN
15.0000 mL | Freq: Once | OROMUCOSAL | Status: AC
  Administered 2021-11-08: 15 mL via OROMUCOSAL

## 2021-11-08 MED ORDER — TRAMADOL HCL 50 MG PO TABS
50.0000 mg | ORAL_TABLET | Freq: Four times a day (QID) | ORAL | 0 refills | Status: AC | PRN
  Filled 2021-11-08: qty 15, 2d supply, fill #0

## 2021-11-08 MED ORDER — BUPIVACAINE-EPINEPHRINE (PF) 0.5% -1:200000 IJ SOLN
INTRAMUSCULAR | Status: DC | PRN
  Administered 2021-11-08: 29 mL

## 2021-11-08 MED ORDER — FENTANYL CITRATE (PF) 100 MCG/2ML IJ SOLN
INTRAMUSCULAR | Status: AC
  Filled 2021-11-08: qty 2

## 2021-11-08 MED ORDER — ONDANSETRON HCL 4 MG/2ML IJ SOLN
INTRAMUSCULAR | Status: AC
  Filled 2021-11-08: qty 2

## 2021-11-08 MED ORDER — PROPOFOL 10 MG/ML IV BOLUS
INTRAVENOUS | Status: DC | PRN
  Administered 2021-11-08: 120 mg via INTRAVENOUS

## 2021-11-08 MED ORDER — FENTANYL CITRATE PF 50 MCG/ML IJ SOSY
25.0000 ug | PREFILLED_SYRINGE | INTRAMUSCULAR | Status: DC | PRN
  Administered 2021-11-08: 50 ug via INTRAVENOUS

## 2021-11-08 MED ORDER — FLEET ENEMA 7-19 GM/118ML RE ENEM
1.0000 | ENEMA | Freq: Once | RECTAL | Status: DC
  Filled 2021-11-08: qty 1

## 2021-11-08 MED ORDER — BUPIVACAINE-EPINEPHRINE (PF) 0.5% -1:200000 IJ SOLN
INTRAMUSCULAR | Status: AC
  Filled 2021-11-08: qty 30

## 2021-11-08 MED ORDER — BUPIVACAINE-EPINEPHRINE (PF) 0.25% -1:200000 IJ SOLN
INTRAMUSCULAR | Status: AC
  Filled 2021-11-08: qty 30

## 2021-11-08 MED ORDER — BUPIVACAINE LIPOSOME 1.3 % IJ SUSP
INTRAMUSCULAR | Status: DC | PRN
  Administered 2021-11-08: 20 mL

## 2021-11-08 MED ORDER — ESTRADIOL 0.1 MG/GM VA CREA
1.0000 | TOPICAL_CREAM | VAGINAL | 1 refills | Status: AC
  Filled 2021-11-08: qty 42.5, 90d supply, fill #0

## 2021-11-08 MED ORDER — SENNA 8.6 MG PO TABS
1.0000 | ORAL_TABLET | Freq: Two times a day (BID) | ORAL | Status: DC
  Administered 2021-11-08 – 2021-11-09 (×2): 8.6 mg via ORAL
  Filled 2021-11-08 (×2): qty 1

## 2021-11-08 MED ORDER — LIDOCAINE 2% (20 MG/ML) 5 ML SYRINGE
INTRAMUSCULAR | Status: DC | PRN
  Administered 2021-11-08: 40 mg via INTRAVENOUS

## 2021-11-08 MED ORDER — ESTRADIOL 0.1 MG/GM VA CREA
TOPICAL_CREAM | VAGINAL | Status: AC
  Filled 2021-11-08: qty 42.5

## 2021-11-08 MED ORDER — LEVOTHYROXINE SODIUM 88 MCG PO TABS
88.0000 ug | ORAL_TABLET | Freq: Every day | ORAL | Status: DC
  Administered 2021-11-09: 88 ug via ORAL
  Filled 2021-11-08: qty 1

## 2021-11-08 MED ORDER — DEXAMETHASONE SODIUM PHOSPHATE 10 MG/ML IJ SOLN
INTRAMUSCULAR | Status: AC
  Filled 2021-11-08: qty 1

## 2021-11-08 MED ORDER — SODIUM CHLORIDE 0.9 % IV SOLN: INTRAVENOUS | Status: DC

## 2021-11-08 SURGICAL SUPPLY — 73 items
BAG COUNTER SPONGE SURGICOUNT (BAG) IMPLANT
BAG URINE DRAIN 2000ML AR STRL (UROLOGICAL SUPPLIES) IMPLANT
BRIEF MESH DISP LRG (UNDERPADS AND DIAPERS) IMPLANT
CATH FOLEY 2WAY SLVR  5CC 16FR (CATHETERS)
CATH FOLEY 2WAY SLVR 5CC 16FR (CATHETERS) ×1 IMPLANT
CATH SILICONE 16FRX5CC (CATHETERS) IMPLANT
CHLORAPREP W/TINT 26 (MISCELLANEOUS) ×1 IMPLANT
CLIP LIGATING HEM O LOK PURPLE (MISCELLANEOUS) ×1 IMPLANT
CLIP LIGATING HEMO LOK XL GOLD (MISCELLANEOUS) IMPLANT
CLIP LIGATING HEMO O LOK GREEN (MISCELLANEOUS) IMPLANT
COVER BACK TABLE 60X90IN (DRAPES) ×1 IMPLANT
COVER SURGICAL LIGHT HANDLE (MISCELLANEOUS) ×1 IMPLANT
COVER TIP SHEARS 8 DVNC (MISCELLANEOUS) ×1 IMPLANT
COVER TIP SHEARS 8MM DA VINCI (MISCELLANEOUS) ×1
DERMABOND ADVANCED .7 DNX12 (GAUZE/BANDAGES/DRESSINGS) ×1 IMPLANT
DRAIN CHANNEL RND F F (WOUND CARE) IMPLANT
DRAPE ARM DVNC X/XI (DISPOSABLE) ×4 IMPLANT
DRAPE COLUMN DVNC XI (DISPOSABLE) ×1 IMPLANT
DRAPE DA VINCI XI ARM (DISPOSABLE) ×4
DRAPE DA VINCI XI COLUMN (DISPOSABLE) ×1
DRAPE INCISE IOBAN 66X45 STRL (DRAPES) ×1 IMPLANT
DRAPE SHEET LG 3/4 BI-LAMINATE (DRAPES) ×2 IMPLANT
DRAPE SURG IRRIG POUCH 19X23 (DRAPES) ×1 IMPLANT
ELECT PENCIL ROCKER SW 15FT (MISCELLANEOUS) ×1 IMPLANT
ELECT REM PT RETURN 15FT ADLT (MISCELLANEOUS) ×1 IMPLANT
GAUZE 4X4 16PLY ~~LOC~~+RFID DBL (SPONGE) IMPLANT
GLOVE BIO SURGEON STRL SZ 6.5 (GLOVE) ×1 IMPLANT
GLOVE BIOGEL PI IND STRL 6.5 (GLOVE) IMPLANT
GLOVE BIOGEL PI IND STRL 8 (GLOVE) IMPLANT
GLOVE SURG LX STRL 7.5 STRW (GLOVE) ×3 IMPLANT
GLOVE SURG SS PI 7.5 STRL IVOR (GLOVE) IMPLANT
GOWN SRG XL LVL 4 BRTHBL STRL (GOWNS) ×1 IMPLANT
GOWN STRL NON-REIN XL LVL4 (GOWNS) ×1
GOWN STRL REUS W/ TWL XL LVL3 (GOWN DISPOSABLE) ×2 IMPLANT
GOWN STRL REUS W/TWL XL LVL3 (GOWN DISPOSABLE) ×2
HOLDER FOLEY CATH W/STRAP (MISCELLANEOUS) ×1 IMPLANT
IRRIG SUCT STRYKERFLOW 2 WTIP (MISCELLANEOUS) ×1
IRRIGATION SUCT STRKRFLW 2 WTP (MISCELLANEOUS) ×1 IMPLANT
KIT BASIN OR (CUSTOM PROCEDURE TRAY) ×1 IMPLANT
KIT TURNOVER KIT A (KITS) IMPLANT
MANIPULATOR UTERINE 4.5 ZUMI (MISCELLANEOUS) IMPLANT
MARKER SKIN DUAL TIP RULER LAB (MISCELLANEOUS) ×1 IMPLANT
MESH Y UPSYLON VAGINAL (Mesh General) IMPLANT
OCCLUDER COLPOPNEUMO (BALLOONS) IMPLANT
PACKING VAGINAL (PACKING) IMPLANT
PAD OB MATERNITY 4.3X12.25 (PERSONAL CARE ITEMS) IMPLANT
PAD POSITIONING PINK XL (MISCELLANEOUS) ×1 IMPLANT
SEAL CANN UNIV 5-8 DVNC XI (MISCELLANEOUS) ×4 IMPLANT
SEAL XI 5MM-8MM UNIVERSAL (MISCELLANEOUS) ×4
SET IRRIG Y TYPE TUR BLADDER L (SET/KITS/TRAYS/PACK) IMPLANT
SET TUBE SMOKE EVAC HIGH FLOW (TUBING) ×1 IMPLANT
SHEET LAVH (DRAPES) ×1 IMPLANT
SOL PREP POV-IOD 4OZ 10% (MISCELLANEOUS) IMPLANT
SOLUTION ELECTROLUBE (MISCELLANEOUS) ×1 IMPLANT
SURGILUBE 2OZ TUBE FLIPTOP (MISCELLANEOUS) IMPLANT
SUT MNCRL AB 4-0 PS2 18 (SUTURE) ×2 IMPLANT
SUT PROLENE 2 0 CT 1 (SUTURE) ×1 IMPLANT
SUT VIC AB 0 CT1 27 (SUTURE) ×1
SUT VIC AB 0 CT1 27XBRD ANTBC (SUTURE) ×1 IMPLANT
SUT VIC AB 2-0 SH 27 (SUTURE) ×5
SUT VIC AB 2-0 SH 27XBRD (SUTURE) ×5 IMPLANT
SUT VIC AB 3-0 SH 27 (SUTURE) ×2
SUT VIC AB 3-0 SH 27X BRD (SUTURE) IMPLANT
SUT VICRYL 0 UR6 27IN ABS (SUTURE) ×1 IMPLANT
SUT VLOC 180 3-0 9IN GS21 (SUTURE) IMPLANT
SYR 50ML LL SCALE MARK (SYRINGE) IMPLANT
SYS BAG RETRIEVAL 10MM (BASKET)
SYSTEM BAG RETRIEVAL 10MM (BASKET) IMPLANT
TOWEL OR 17X26 10 PK STRL BLUE (TOWEL DISPOSABLE) ×1 IMPLANT
TRAY LAPAROSCOPIC (CUSTOM PROCEDURE TRAY) ×1 IMPLANT
TROCAR Z THREAD OPTICAL 12X100 (TROCAR) ×1 IMPLANT
TROCAR Z-THREAD FIOS 5X100MM (TROCAR) ×1 IMPLANT
WATER STERILE IRR 1000ML POUR (IV SOLUTION) ×1 IMPLANT

## 2021-11-08 NOTE — Transfer of Care (Signed)
Immediate Anesthesia Transfer of Care Note  Patient: Martha [name]  Procedure(s) Performed: XI ROBOTIC ASSISTED LAPAROSCOPIC SACROCOLPOPEXY, LYSIS OF ADHESIONS  Patient Location: PACU  Anesthesia Type:General  Level of Consciousness: oriented, drowsy and patient cooperative  Airway & Oxygen Therapy: Patient Spontanous Breathing and Patient connected to face mask oxygen  Post-op Assessment: Report given to RN and Post -op Vital signs reviewed and stable  Post vital signs: Reviewed  Last Vitals:  Vitals Value Taken Time  BP 177/96 11/08/21 1223  Temp    Pulse 85 11/08/21 1227  Resp 16 11/08/21 1227  SpO2 100 % 11/08/21 1227  Vitals shown include unvalidated device data.  Last Pain:  Vitals:   11/08/21 1460  TempSrc:   PainSc: 0-No pain      Patients Stated Pain Goal: 3 (47/99/87 2158)  Complications: No notable events documented.

## 2021-11-08 NOTE — H&P (Signed)
cc: I can feel my bladder between my legs   Ms. Karpel is an 86 year old female who presents today with worsening polyuria   -Seen by Dr. Matilde Sprang in 2018 and was found to have a grade 3 cystocele and a grade 1 rectocele. Dr. Matilde Sprang felt she would benefit from a vaginal vault suspension, but no procedures were ever performed. No prior pessary use. She notes urinary urgency and frequency q 2 hours. Wearing 2-3 ppd due to little warning to void. Prior hx of chronic constipation. Using vaginal estrogen cream due to excoriation. Hx of open hysterectomy in her late 74s. No prior GU surgery or trauma. No prior history of UTIs.   She has had symptomatic prolapse now for many years, and at this point her pain has started to progress. She also has significant worsening of her urinary incontinence.   The patient had a hysterectomy 40 years prior.   The patient is otherwise quite healthy with very little past medical history or surgical history.   The patient lives with her handicapped child, she is her full-time care provider.   Interval: Today the patient is here for follow-up. She has undergone urodynamics demonstrating a fairly normal capacity bladder and complete emptying without evidence of stress or urge incontinence. She is here for further surgical consultation.   10/28/21: Ms. Severin is a 86 year old female who is scheduled to undergo sacral colpopexy with Dr. Louis Meckel in 10 days. She is doing well and looking forward to her surgery. She denies any interval changes to her health history or current allergy list. Currently on vaginal Estrace cream. She denies any recent chest pain, shortness of breath, fevers, chills, dysuria.     ALLERGIES: Benicar - Heart palpitations Celebrex - Skin Rash Contrast Dye - Hives Latex - Skin Rash Levothyroxine - Skin Rash, Itching Synthroid    MEDICATIONS: Crestor 20 mg tablet  B Complex  Benicar Hct 40 mg-12.5 mg tablet  Calcium Carbonate  Multivitamin   Synthroid 88 mcg tablet  Vitamin D3     GU PSH: Complex cystometrogram, w/ void pressure and urethral pressure profile studies, any technique - 09/10/2021 Complex Uroflow - 09/10/2021 Emg surf Electrd - 09/10/2021 Hysterectomy - 2010 Intrabd voidng Press - 09/10/2021     NON-GU PSH: Eye Surgery (Unspecified) - 1971 Remove Thyroid     GU PMH: Cystocele, midline - 09/17/2021, - 08/12/2021, - 07/29/2021, - 2018 Urinary Urgency - 09/17/2021, - 07/29/2021 Urge incontinence - 09/10/2021, - 08/27/2021, - 08/12/2021, - 2018 Cystocele, Unspec - 08/27/2021 Nocturia - 2018 Urinary Frequency - 2018 Kidney Failure Unspec    NON-GU PMH: Muscle weakness (generalized) - 08/27/2021 Other specified disorders of muscle - 08/27/2021 Chronic idiopathic constipation - 08/12/2021 Arthritis Glaucoma Heartburn Hypercholesterolemia Hypertension    FAMILY HISTORY: 2 daughters - Other Cancer - Brother father deceased at age 31 - Other History of heart bypass surgery - Brother Kidney Failure - Runs in Family mother deceased at age 18 - Other Myocardial Infarction - Runs in Family   SOCIAL HISTORY: Marital Status: Widowed Ethnicity: Not Hispanic Or Latino; Race: Black or African American Current Smoking Status: Patient has never smoked.   Tobacco Use Assessment Completed: Used Tobacco in last 30 days? Has never drank.  Does not drink caffeine. 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    REVIEW OF SYSTEMS:    GU Review Female:   Patient denies frequent urination, hard to postpone urination, burning /pain with urination, get up at night to urinate, leakage of urine, stream starts and  stops, trouble starting your stream, have to strain to urinate, and being pregnant.  Gastrointestinal (Upper):   Patient denies nausea, vomiting, and indigestion/ heartburn.  Gastrointestinal (Lower):   Patient denies diarrhea and constipation.  Constitutional:   Patient denies fever, night sweats, weight loss, and  fatigue.  Skin:   Patient denies skin rash/ lesion and itching.  Ears/ Nose/ Throat:   Patient denies sore throat and sinus problems.  Hematologic/Lymphatic:   Patient denies swollen glands and easy bruising.  Cardiovascular:   Patient denies leg swelling and chest pains.  Respiratory:   Patient denies cough and shortness of breath.  Musculoskeletal:   Patient denies back pain and joint pain.  Neurological:   Patient denies headaches and dizziness.  Psychologic:   Patient denies depression and anxiety.   VITAL SIGNS:      10/28/2021 08:41 AM  Weight 137 lb / 62.14 kg  Height 60 in / 152.4 cm  BP 167/102 mmHg  Pulse 74 /min  Temperature 97.3 F / 36.2 C  BMI 26.8 kg/m   MULTI-SYSTEM PHYSICAL EXAMINATION:    Constitutional: Well-nourished. No physical deformities. Normally developed. Good grooming.   Neck: Neck symmetrical, not swollen. Normal tracheal position.  Respiratory: Normal breath sounds. No labored breathing, no use of accessory muscles.   Cardiovascular: Normal temperature, normal extremity pulses, no swelling, no varicosities.  Skin: No paleness, no jaundice, no cyanosis. No lesion, no ulcer, no rash.  Neurologic / Psychiatric: Oriented to time, oriented to place, oriented to person. No depression, no anxiety, no agitation.  Gastrointestinal: No mass, no tenderness, no rigidity, non obese abdomen.  Ears, Nose, Mouth, and Throat: Left ear no scars, no lesions, no masses. Right ear no scars, no lesions, no masses. Nose no scars, no lesions, no masses. Normal hearing. Normal lips.  Musculoskeletal: Normal gait and station of head and neck.     Complexity of Data:  Source Of History:  Patient  Records Review:   Previous Doctor Records, Previous Patient Records  Urine Test Review:   Urinalysis   10/28/21  Urinalysis  Urine Appearance Clear   Urine Color Straw   Urine Glucose Neg mg/dL  Urine Bilirubin Neg mg/dL  Urine Ketones Neg mg/dL  Urine Specific Gravity 1.015    Urine Blood Neg ery/uL  Urine pH 6.0   Urine Protein Neg mg/dL  Urine Urobilinogen 0.2 mg/dL  Urine Nitrites Neg   Urine Leukocyte Esterase Neg leu/uL   PROCEDURES:          Urinalysis Dipstick Dipstick Cont'd  Color: Straw Bilirubin: Neg mg/dL  Appearance: Clear Ketones: Neg mg/dL  Specific Gravity: 1.015 Blood: Neg ery/uL  pH: 6.0 Protein: Neg mg/dL  Glucose: Neg mg/dL Urobilinogen: 0.2 mg/dL    Nitrites: Neg    Leukocyte Esterase: Neg leu/uL    ASSESSMENT:      ICD-10 Details  1 GU:   Cystocele, midline - N81.11 Chronic, Stable   PLAN:           Document Letter(s):  Created for Patient: Clinical Summary         Notes:   She is cleared to proceed with her surgery as scheduled on 11/08/2021 for sacrocolpopexy with Dr. Louis Meckel. She understands that recovery time of the surgery is approximately 6 to 8 weeks and she should avoid heavy lifting as well as bending, and twisting. She does have someone coming to help her in the home over the 2 weeks postoperatively. She understands that if any of her medical history  changes or she develops fevers, chest pain, chills, shortness of breath she should notify the office prior to undergoing her procedure. All of her questions were answered to the best of my ability.

## 2021-11-08 NOTE — Discharge Instructions (Signed)

## 2021-11-08 NOTE — Progress Notes (Signed)
  Transition of Care [facility]) Screening Note   Patient Details  Name: Isolde Skaff Date of Birth: October 02, 1935   Transition of [facility]) CM/SW Contact:    Dessa Phi, RN Phone Number: 11/08/2021, 3:40 PM    Transition of Care Department [facility]) has reviewed patient and no TOC needs have been identified at this time. We will continue to monitor patient advancement through interdisciplinary progression rounds. If new patient transition needs arise, please place a TOC consult.

## 2021-11-08 NOTE — Interval H&P Note (Signed)
History and Physical Interval Note:  11/08/2021 7:09 AM  Martha [name] Arther Abbott  has presented today for surgery, with the diagnosis of CYSTOCELE, MIDLINE.  The various methods of treatment have been discussed with the patient and family. After consideration of risks, benefits and other options for treatment, the patient has consented to  Procedure(s) with comments: XI ROBOTIC ASSISTED LAPAROSCOPIC SACROCOLPOPEXY (N/A) - 240 MINUTES NEEDED as a surgical intervention.  The patient's history has been reviewed, patient examined, no change in status, stable for surgery.  I have reviewed the patient's chart and labs.  Questions were answered to the patient's satisfaction.     Ardis Hughs

## 2021-11-08 NOTE — Anesthesia Procedure Notes (Signed)
Procedure Name: Intubation Date/Time: 11/08/2021 7:49 AM  Performed by: Jenne Campus, CRNAPre-anesthesia Checklist: Patient identified, Emergency Drugs available, Suction available and Patient being monitored Patient Re-evaluated:Patient Re-evaluated prior to induction Oxygen Delivery Method: Circle System Utilized Preoxygenation: Pre-oxygenation with 100% oxygen Induction Type: IV induction Ventilation: Mask ventilation without difficulty Laryngoscope Size: Miller and 2 Grade View: Grade III Tube type: Oral Tube size: 7.0 mm Number of attempts: 1 Airway Equipment and Method: Stylet and Oral airway Placement Confirmation: ETT inserted through vocal cords under direct vision, positive ETCO2 and breath sounds checked- equal and bilateral Secured at: 21 cm Tube secured with: Tape Dental Injury: Teeth and Oropharynx as per pre-operative assessment

## 2021-11-08 NOTE — Op Note (Addendum)
Preoperative diagnosis:  Pelvic organ prolapse   Postoperative diagnosis:  same   Procedure: Robotic assisted laparoscopic sacrocolpopexy Lysis of adhesions - 42mn  Surgeon: BArdis Hughs MD 1st assistant: MMargarita Sermons MD  Anesthesia: General  Complications: None  Intraoperative findings: [employer] Y-Mesh placed, cut to specific vaginal length  EBL: 25cc  Specimens: None  Indication: FFranshesca Chipmanis a 86y.o. female patient with symptomatic pelvic organ prolapse.  After reviewing the management options for treatment, he elected to proceed with the above surgical procedure(s). We have discussed the potential benefits and risks of the procedure, side effects of the proposed treatment, the likelihood of the patient achieving the goals of the procedure, and any potential problems that might occur during the procedure or recuperation. Informed consent has been obtained.  Description of procedure:  A Foley catheter was then placed and placed to gravity drainage. I then made a periumbilical incision carrying the dissection down to the patient's fascia with electrocautery.  Once to the fascia, the fascia was incised the peritoneum opened.  A 876mport was then placed into the abdomen.  The abdomen was insufflated and the remaining ports placed under digital guidance.  2 ports were placed lateral to the umbilicus on the right proximally 10 cm apart.  The most lateral port was approximately 3 cm above the anterior iliac spine.  2 additional ports were placed in the patient's right side in comparable positions to the most lateral port on the right was a 12 mm port.the robot was then docked at an angle from the leg obliquely along the side of the left leg.  We then began our surgery by cleaning up some of the pelvic adhesions to the small bowel and colon.   There was an extensive amount of adhesions - the ileum was adherent into the pelvis and there were a lot of  adhesions along the descending colon/sigmoid onto the side wall.  Once this was completed I started dissecting at the sacral promontory located 3 cm medial to the location where the ureter crosses over the iliac vessels at the pelvic brim. The posterior peritoneum was incised and the sacral prominence cleared off an area taking care to avoid the middle sacral vessels and the iliac branches.  I then created a posterior peritoneal tunnel starting at the sacral promontory and tunneling down the right pelvic sidewall down into the pelvis breaking back through the posterior peritoneum around the vesico-vaginal junction posteriorly.  I then continued the posterior dissection retracting down on the rectum and finding the avascular plane between the posterior vaginal wall and the rectum.  I carried this dissection down as far as I could to along the area of the perineal body.  I then turned my attention to the anterior plane between the anterior vaginal wall and the bladder.  I was able to obtain access to the avascular plane and with a combination of both monopolar cautery and blunt dissection was able to clean and nice down to the bladder neck.  I then turned my attention back to the patient's uterus and skeletonized the right uterine artery and vein and then took this with a series of bipolar moves.  I then performed a similar uterus pedicle ligation on the left.    Mesh was measured at approximately 6.5 cm anteriorly and 6.5 cm posteriorly and I cut this on the back table.  The mesh was then placed into the patient's abdomen through the assistant port and the anterior  leaf was secured down onto the anterior vaginal wall with the apex at the bladder neck.  The posterior leaf was then secured down on the posterior vaginal wall.  These were sewn down with 2-0 Vicryl.  Between 6 and 8 were done on each side.  At this point I then went back to the previously dissected sacral promontory and posterior peritoneal tunnel and  inserted a instrument through the tunnel and grasped the end of the mesh at the vaginal cuff and pull it up to the sacrum.  I then checked to ensure that the sacral mesh was not too tight by performing a vaginal exam.  I then secured the sacral leg of the mesh using a 0 Prolene.  I then reapproximated the posterior peritoneum with a 2-0 Vicryl in a running fashion around the sacral promontory.  The pelvic peritoneum was closed using a pursestring.   The fascia of the 12 mm port was then closed with 0 Vicryl in a figure-of-eight fashion.  The skin was closed with 4-0 Monocryl's.  Dermabond was applied to the incision and exparel injected into the incisions.  Estrace impregnated packing was then placed into her vagina which will be left in overnight.  The patient was subsequently extubated and returned to the PACU in excellent condition.   Ardis Hughs, M.D.

## 2021-11-09 ENCOUNTER — Encounter (HOSPITAL_COMMUNITY): Payer: Self-pay | Admitting: Urology

## 2021-11-09 DIAGNOSIS — Z91040 Latex allergy status: Secondary | ICD-10-CM | POA: Diagnosis not present

## 2021-11-09 DIAGNOSIS — N819 Female genital prolapse, unspecified: Secondary | ICD-10-CM | POA: Diagnosis not present

## 2021-11-09 DIAGNOSIS — Z79899 Other long term (current) drug therapy: Secondary | ICD-10-CM | POA: Diagnosis not present

## 2021-11-09 DIAGNOSIS — I10 Essential (primary) hypertension: Secondary | ICD-10-CM | POA: Diagnosis not present

## 2021-11-09 LAB — CBC
HCT: 33.7 % — ABNORMAL LOW (ref 36.0–46.0)
Hemoglobin: 11 g/dL — ABNORMAL LOW (ref 12.0–15.0)
MCH: 33.3 pg (ref 26.0–34.0)
MCHC: 32.6 g/dL (ref 30.0–36.0)
MCV: 102.1 fL — ABNORMAL HIGH (ref 80.0–100.0)
Platelets: 152 10*3/uL (ref 150–400)
RBC: 3.3 MIL/uL — ABNORMAL LOW (ref 3.87–5.11)
RDW: 14.1 % (ref 11.5–15.5)
WBC: 9.1 10*3/uL (ref 4.0–10.5)
nRBC: 0 % (ref 0.0–0.2)

## 2021-11-09 LAB — BASIC METABOLIC PANEL
Anion gap: 8 (ref 5–15)
BUN: 24 mg/dL — ABNORMAL HIGH (ref 8–23)
CO2: 25 mmol/L (ref 22–32)
Calcium: 7.5 mg/dL — ABNORMAL LOW (ref 8.9–10.3)
Chloride: 103 mmol/L (ref 98–111)
Creatinine, Ser: 1.49 mg/dL — ABNORMAL HIGH (ref 0.44–1.00)
GFR, Estimated: 34 mL/min — ABNORMAL LOW (ref >60)
Glucose, Bld: 99 mg/dL (ref 70–99)
Potassium: 3.9 mmol/L (ref 3.5–5.1)
Sodium: 136 mmol/L (ref 135–145)

## 2021-11-09 NOTE — Discharge Summary (Signed)
Physician Discharge Summary  Patient ID: Taniaya Rudder MRN: [id number] DOB/AGE: 05-04-1935 86 y.o.  Admit date: 11/08/2021 Discharge date: 11/09/2021  Admission Diagnoses: Pelvic organ Prolapse  Discharge Diagnoses:  Principal Problem:   Pelvic prolapse   Discharged Condition: good  Hospital Course: Pt had uncomplicated robotic sacrocolpopexy on 11/09/26, the day of admission, without acute complication. Observed on 4th floor Urology service post-op. POD1 AM Hgb 11 and Cr 1.49 (stable). Catheter and vaginal packign removed. By the afternoon of POD 1 she is ambulatory, voiding to completion (PVR <32m), maintaining PO nutrition/hydration, pain controlled on PO meds, and felt to be adequate for discharge.   Consults: None  Significant Diagnostic Studies: labs: as per above  Treatments: surgery: as per above  Discharge Exam: Blood pressure (!) 153/80, pulse 67, temperature 99 F (37.2 C), temperature source Oral, resp. rate 16, height 5' (1.524 m), weight 64.1 kg, SpO2 99 %.  NAD, very spry, wears glasses, family at bedside Non-labored breathing on RA RRR SNTND, recent surgical sites c/d/I with minimal bruisiing No heavy vaginal discharge No c/c/e  Disposition: HOME   Allergies as of 11/09/2021       Reactions   Rosuvastatin Itching   GENERIC CRESTOR   Celecoxib    Contrast Media [iodinated Contrast Media] Hives, Nausea And Vomiting   Levothyroxine Itching   Just generic, can tolerate brand   Olmesartan    Other reaction(s): Other (See Comments) Just generic. Brand okay.    Latex Rash        Medication List     TAKE these medications    amLODipine 5 MG tablet Commonly known as: NORVASC Take 1 tablet (5 mg total) by mouth daily.   b complex vitamins capsule Take 1 capsule by mouth daily.   calcium carbonate 600 MG Tabs tablet Commonly known as: OS-CAL Take 600 mg by mouth 2 (two) times daily with a meal.   cholecalciferol 1000 units  tablet Commonly known as: VITAMIN D Take 1,000 Units by mouth daily.   estradiol 0.1 MG/GM vaginal cream Commonly known as: ESTRACE VAGINAL Place 1 Applicatorful vaginally 3 (three) times a week. Use 1 small dolyp of cream on tip of index finger and swap the inside of the vagina   hydrocortisone 25 MG suppository Commonly known as: ANUSOL-HC Unwrap and insert 1 suppository into the rectum once daily at bedtime for 14 days. (1 suppository Rectal Once a day at bedtime 14 days)   levothyroxine 88 MCG tablet Commonly known as: Synthroid Take 1 tablet by mouth every morning on an empty stomach. (1 tablet in the morning on an empty stomach Orally Once a day 30 day(s))   multivitamin with minerals Tabs tablet Take 1 tablet by mouth daily.   olmesartan-hydrochlorothiazide 20-12.5 MG tablet Commonly known as: BENICAR HCT Take 1 tablet by mouth daily.   rosuvastatin 20 MG tablet Commonly known as: CRESTOR Take 1 tablet (20 mg total) by mouth daily.   traMADol 50 MG tablet Commonly known as: Ultram Take 1-2 tablets (50-100 mg total) by mouth every 6 (six) hours as needed for moderate pain.        Follow-up Information     DHollace Hayward NP Follow up on 11/22/2021.   Why: 12:45pm, For wound re-check Contact information: [address] [phone]                 Signed: TAlexis Frock10/28/2023, 3:10 PM

## 2021-11-09 NOTE — Anesthesia Postprocedure Evaluation (Signed)
Anesthesia Post Note  Patient: Martha [name]  Procedure(s) Performed: XI ROBOTIC ASSISTED LAPAROSCOPIC SACROCOLPOPEXY, LYSIS OF ADHESIONS     Patient location during evaluation: PACU Anesthesia Type: General Level of consciousness: awake and alert Pain management: pain level controlled Vital Signs Assessment: post-procedure vital signs reviewed and stable Respiratory status: spontaneous breathing, nonlabored ventilation, respiratory function stable and patient connected to nasal cannula oxygen Cardiovascular status: blood pressure returned to baseline and stable Postop Assessment: no apparent nausea or vomiting Anesthetic complications: no   No notable events documented.              Effie Berkshire

## 2021-11-09 NOTE — Progress Notes (Signed)
Bladder scan completed 47 cc in bladder post void x 2.

## 2021-11-09 NOTE — Progress Notes (Signed)
No change from am assessment. The patient is alert, oriented x 4 and ambulatory without assistance. Incisions are mildly ecchymotic. No change from admission. Discharge instructions reviewed with patient and daughter. No questions, concerns at this time.

## 2021-11-09 NOTE — Progress Notes (Signed)
  Transition of Care [facility]) Screening Note   Patient Details  Name: Martha [name] Date of Birth: 21-Apr-1935   Transition of Care [facility]) CM/SW Contact:    Kimber Relic, LCSW Phone Number: 11/09/2021, 11:23 AM    Transition of Care Department [facility]) has reviewed patient and no TOC needs have been identified at this time. We will continue to monitor patient advancement through interdisciplinary progression rounds. If new patient transition needs arise, please place a TOC consult.

## 2021-11-11 ENCOUNTER — Other Ambulatory Visit (HOSPITAL_BASED_OUTPATIENT_CLINIC_OR_DEPARTMENT_OTHER): Payer: Self-pay

## 2021-11-13 ENCOUNTER — Other Ambulatory Visit (HOSPITAL_BASED_OUTPATIENT_CLINIC_OR_DEPARTMENT_OTHER): Payer: PPO

## 2021-11-22 ENCOUNTER — Ambulatory Visit (HOSPITAL_BASED_OUTPATIENT_CLINIC_OR_DEPARTMENT_OTHER)
Admission: RE | Admit: 2021-11-22 | Discharge: 2021-11-22 | Disposition: A | Discharge status: Home / Self Care | Payer: Medicare PPO | Source: Ambulatory Visit | Attending: Internal Medicine | Admitting: Internal Medicine

## 2021-11-22 DIAGNOSIS — N8111 Cystocele, midline: Secondary | ICD-10-CM | POA: Diagnosis not present

## 2021-11-22 DIAGNOSIS — E7849 Other hyperlipidemia: Secondary | ICD-10-CM | POA: Insufficient documentation

## 2021-12-27 DIAGNOSIS — N3941 Urge incontinence: Secondary | ICD-10-CM | POA: Diagnosis not present

## 2022-01-21 DIAGNOSIS — C73 Malignant neoplasm of thyroid gland: Secondary | ICD-10-CM | POA: Diagnosis not present

## 2022-01-21 DIAGNOSIS — E890 Postprocedural hypothyroidism: Secondary | ICD-10-CM | POA: Diagnosis not present

## 2022-01-28 DIAGNOSIS — C73 Malignant neoplasm of thyroid gland: Secondary | ICD-10-CM | POA: Diagnosis not present

## 2022-01-28 DIAGNOSIS — E890 Postprocedural hypothyroidism: Secondary | ICD-10-CM | POA: Diagnosis not present

## 2022-01-28 DIAGNOSIS — I10 Essential (primary) hypertension: Secondary | ICD-10-CM | POA: Diagnosis not present

## 2022-02-05 ENCOUNTER — Ambulatory Visit (HOSPITAL_BASED_OUTPATIENT_CLINIC_OR_DEPARTMENT_OTHER): Payer: PPO | Admitting: Internal Medicine

## 2022-03-06 DIAGNOSIS — N2581 Secondary hyperparathyroidism of renal origin: Secondary | ICD-10-CM | POA: Diagnosis not present

## 2022-03-06 DIAGNOSIS — I129 Hypertensive chronic kidney disease with stage 1 through stage 4 chronic kidney disease, or unspecified chronic kidney disease: Secondary | ICD-10-CM | POA: Diagnosis not present

## 2022-03-06 DIAGNOSIS — D631 Anemia in chronic kidney disease: Secondary | ICD-10-CM | POA: Diagnosis not present

## 2022-03-06 DIAGNOSIS — N189 Chronic kidney disease, unspecified: Secondary | ICD-10-CM | POA: Diagnosis not present

## 2022-03-06 DIAGNOSIS — N1832 Chronic kidney disease, stage 3b: Secondary | ICD-10-CM | POA: Diagnosis not present

## 2022-04-01 DIAGNOSIS — I10 Essential (primary) hypertension: Secondary | ICD-10-CM | POA: Diagnosis not present

## 2022-04-01 DIAGNOSIS — N2581 Secondary hyperparathyroidism of renal origin: Secondary | ICD-10-CM | POA: Diagnosis not present

## 2022-04-01 DIAGNOSIS — E039 Hypothyroidism, unspecified: Secondary | ICD-10-CM | POA: Diagnosis not present

## 2022-04-01 DIAGNOSIS — F330 Major depressive disorder, recurrent, mild: Secondary | ICD-10-CM | POA: Diagnosis not present

## 2022-04-01 DIAGNOSIS — N1832 Chronic kidney disease, stage 3b: Secondary | ICD-10-CM | POA: Diagnosis not present

## 2022-05-20 ENCOUNTER — Other Ambulatory Visit: Payer: Self-pay | Admitting: Family Medicine

## 2022-05-20 DIAGNOSIS — Z1231 Encounter for screening mammogram for malignant neoplasm of breast: Secondary | ICD-10-CM

## 2022-05-27 DIAGNOSIS — G9520 Unspecified cord compression: Secondary | ICD-10-CM | POA: Diagnosis not present

## 2022-05-27 DIAGNOSIS — Z0000 Encounter for general adult medical examination without abnormal findings: Secondary | ICD-10-CM | POA: Diagnosis not present

## 2022-05-27 DIAGNOSIS — Z79899 Other long term (current) drug therapy: Secondary | ICD-10-CM | POA: Diagnosis not present

## 2022-05-27 DIAGNOSIS — N2581 Secondary hyperparathyroidism of renal origin: Secondary | ICD-10-CM | POA: Diagnosis not present

## 2022-05-27 DIAGNOSIS — D7589 Other specified diseases of blood and blood-forming organs: Secondary | ICD-10-CM | POA: Diagnosis not present

## 2022-05-27 DIAGNOSIS — N1832 Chronic kidney disease, stage 3b: Secondary | ICD-10-CM | POA: Diagnosis not present

## 2022-05-27 DIAGNOSIS — E039 Hypothyroidism, unspecified: Secondary | ICD-10-CM | POA: Diagnosis not present

## 2022-05-27 DIAGNOSIS — E785 Hyperlipidemia, unspecified: Secondary | ICD-10-CM | POA: Diagnosis not present

## 2022-05-28 LAB — LAB REPORT - SCANNED: EGFR: 36

## 2022-07-01 ENCOUNTER — Ambulatory Visit
Admission: RE | Admit: 2022-07-01 | Discharge: 2022-07-01 | Disposition: A | Discharge status: Home / Self Care | Payer: Medicare PPO | Source: Ambulatory Visit | Attending: Family Medicine | Admitting: Family Medicine

## 2022-07-01 DIAGNOSIS — Z1231 Encounter for screening mammogram for malignant neoplasm of breast: Secondary | ICD-10-CM | POA: Diagnosis not present

## 2022-07-15 DIAGNOSIS — E039 Hypothyroidism, unspecified: Secondary | ICD-10-CM | POA: Diagnosis not present

## 2022-07-15 DIAGNOSIS — E785 Hyperlipidemia, unspecified: Secondary | ICD-10-CM | POA: Diagnosis not present

## 2022-08-19 DIAGNOSIS — N189 Chronic kidney disease, unspecified: Secondary | ICD-10-CM | POA: Diagnosis not present

## 2022-08-19 DIAGNOSIS — D631 Anemia in chronic kidney disease: Secondary | ICD-10-CM | POA: Diagnosis not present

## 2022-08-19 DIAGNOSIS — I129 Hypertensive chronic kidney disease with stage 1 through stage 4 chronic kidney disease, or unspecified chronic kidney disease: Secondary | ICD-10-CM | POA: Diagnosis not present

## 2022-08-19 DIAGNOSIS — N1832 Chronic kidney disease, stage 3b: Secondary | ICD-10-CM | POA: Diagnosis not present

## 2022-08-19 DIAGNOSIS — N2581 Secondary hyperparathyroidism of renal origin: Secondary | ICD-10-CM | POA: Diagnosis not present

## 2022-09-22 ENCOUNTER — Ambulatory Visit: Payer: Medicare PPO | Admitting: Neurology

## 2022-09-22 ENCOUNTER — Encounter: Payer: Self-pay | Admitting: Neurology

## 2022-09-22 ENCOUNTER — Telehealth: Payer: Self-pay | Admitting: Neurology

## 2022-09-22 NOTE — Telephone Encounter (Signed)
Pt cancelled appt due to not feeling well

## 2022-10-07 DIAGNOSIS — Z23 Encounter for immunization: Secondary | ICD-10-CM | POA: Diagnosis not present

## 2022-12-09 ENCOUNTER — Ambulatory Visit: Payer: Medicare PPO | Admitting: Neurology

## 2022-12-09 ENCOUNTER — Telehealth: Payer: Self-pay | Admitting: Neurology

## 2022-12-09 NOTE — Telephone Encounter (Signed)
Pt cancelled appt due to rainy weather, unable to come out.

## 2022-12-18 DIAGNOSIS — I739 Peripheral vascular disease, unspecified: Secondary | ICD-10-CM | POA: Diagnosis not present

## 2022-12-18 DIAGNOSIS — M1990 Unspecified osteoarthritis, unspecified site: Secondary | ICD-10-CM | POA: Diagnosis not present

## 2022-12-18 DIAGNOSIS — E785 Hyperlipidemia, unspecified: Secondary | ICD-10-CM | POA: Diagnosis not present

## 2022-12-18 DIAGNOSIS — H9193 Unspecified hearing loss, bilateral: Secondary | ICD-10-CM | POA: Diagnosis not present

## 2022-12-18 DIAGNOSIS — Z8249 Family history of ischemic heart disease and other diseases of the circulatory system: Secondary | ICD-10-CM | POA: Diagnosis not present

## 2022-12-18 DIAGNOSIS — N1832 Chronic kidney disease, stage 3b: Secondary | ICD-10-CM | POA: Diagnosis not present

## 2022-12-18 DIAGNOSIS — I129 Hypertensive chronic kidney disease with stage 1 through stage 4 chronic kidney disease, or unspecified chronic kidney disease: Secondary | ICD-10-CM | POA: Diagnosis not present

## 2022-12-18 DIAGNOSIS — Z809 Family history of malignant neoplasm, unspecified: Secondary | ICD-10-CM | POA: Diagnosis not present

## 2022-12-18 DIAGNOSIS — E890 Postprocedural hypothyroidism: Secondary | ICD-10-CM | POA: Diagnosis not present

## 2023-03-13 ENCOUNTER — Other Ambulatory Visit: Payer: Self-pay | Admitting: Family Medicine

## 2023-03-13 DIAGNOSIS — Z1231 Encounter for screening mammogram for malignant neoplasm of breast: Secondary | ICD-10-CM

## 2023-03-16 DIAGNOSIS — C73 Malignant neoplasm of thyroid gland: Secondary | ICD-10-CM | POA: Diagnosis not present

## 2023-03-16 DIAGNOSIS — E890 Postprocedural hypothyroidism: Secondary | ICD-10-CM | POA: Diagnosis not present

## 2023-03-16 DIAGNOSIS — I10 Essential (primary) hypertension: Secondary | ICD-10-CM | POA: Diagnosis not present

## 2023-04-01 DIAGNOSIS — M25561 Pain in right knee: Secondary | ICD-10-CM | POA: Diagnosis not present

## 2023-04-01 DIAGNOSIS — I10 Essential (primary) hypertension: Secondary | ICD-10-CM | POA: Diagnosis not present

## 2023-04-01 DIAGNOSIS — Z636 Dependent relative needing care at home: Secondary | ICD-10-CM | POA: Diagnosis not present

## 2023-04-01 DIAGNOSIS — R1031 Right lower quadrant pain: Secondary | ICD-10-CM | POA: Diagnosis not present

## 2023-04-02 ENCOUNTER — Ambulatory Visit
Admission: RE | Admit: 2023-04-02 | Discharge: 2023-04-02 | Disposition: A | Discharge status: Home / Self Care | Source: Ambulatory Visit | Attending: Family Medicine | Admitting: Family Medicine

## 2023-04-02 ENCOUNTER — Other Ambulatory Visit: Payer: Self-pay | Admitting: Student

## 2023-04-02 ENCOUNTER — Ambulatory Visit
Admission: RE | Admit: 2023-04-02 | Discharge: 2023-04-02 | Disposition: A | Discharge status: Home / Self Care | Source: Ambulatory Visit | Attending: Student | Admitting: Student

## 2023-04-02 ENCOUNTER — Other Ambulatory Visit: Payer: Self-pay | Admitting: Family Medicine

## 2023-04-02 DIAGNOSIS — M25561 Pain in right knee: Secondary | ICD-10-CM | POA: Diagnosis not present

## 2023-04-02 DIAGNOSIS — M11261 Other chondrocalcinosis, right knee: Secondary | ICD-10-CM | POA: Diagnosis not present

## 2023-04-02 DIAGNOSIS — R109 Unspecified abdominal pain: Secondary | ICD-10-CM

## 2023-07-02 ENCOUNTER — Ambulatory Visit: Payer: Medicare PPO
